# Patient Record
Sex: Male | Born: 1940 | Race: White | Hispanic: No | Marital: Married | Smoking: Former smoker
Health system: Southern US, Community
[De-identification: ages and names within clinical notes are randomized; demographics above are authoritative.]

## PROBLEM LIST (undated history)

## (undated) ENCOUNTER — Ambulatory Visit

## (undated) DIAGNOSIS — M1990 Unspecified osteoarthritis, unspecified site: Secondary | ICD-10-CM

## (undated) DIAGNOSIS — R112 Nausea with vomiting, unspecified: Secondary | ICD-10-CM

## (undated) DIAGNOSIS — Z9889 Other specified postprocedural states: Secondary | ICD-10-CM

## (undated) DIAGNOSIS — T300 Burn of unspecified body region, unspecified degree: Secondary | ICD-10-CM

## (undated) DIAGNOSIS — I10 Essential (primary) hypertension: Secondary | ICD-10-CM

## (undated) DIAGNOSIS — K219 Gastro-esophageal reflux disease without esophagitis: Secondary | ICD-10-CM

## (undated) DIAGNOSIS — Z87442 Personal history of urinary calculi: Secondary | ICD-10-CM

## (undated) DIAGNOSIS — J189 Pneumonia, unspecified organism: Secondary | ICD-10-CM

## (undated) DIAGNOSIS — E7800 Pure hypercholesterolemia, unspecified: Secondary | ICD-10-CM

## (undated) HISTORY — PX: SKIN GRAFT: SHX250

## (undated) HISTORY — PX: CHOLECYSTECTOMY: SHX55

## (undated) HISTORY — PX: OTHER SURGICAL HISTORY: SHX169

---

## 2000-02-20 ENCOUNTER — Emergency Department (HOSPITAL_COMMUNITY): Admission: EM | Admit: 2000-02-20 | Discharge: 2000-02-20 | Payer: Self-pay | Admitting: Emergency Medicine

## 2000-02-20 ENCOUNTER — Encounter: Payer: Self-pay | Admitting: Emergency Medicine

## 2001-06-27 ENCOUNTER — Encounter: Payer: Self-pay | Admitting: Family Medicine

## 2001-06-27 ENCOUNTER — Encounter: Admission: RE | Admit: 2001-06-27 | Discharge: 2001-06-27 | Payer: Self-pay | Admitting: Family Medicine

## 2002-06-12 ENCOUNTER — Ambulatory Visit (HOSPITAL_COMMUNITY): Admission: RE | Admit: 2002-06-12 | Discharge: 2002-06-12 | Payer: Self-pay | Admitting: Gastroenterology

## 2002-06-12 ENCOUNTER — Encounter (INDEPENDENT_AMBULATORY_CARE_PROVIDER_SITE_OTHER): Payer: Self-pay | Admitting: *Deleted

## 2009-02-10 ENCOUNTER — Ambulatory Visit (HOSPITAL_BASED_OUTPATIENT_CLINIC_OR_DEPARTMENT_OTHER): Admission: RE | Admit: 2009-02-10 | Discharge: 2009-02-10 | Payer: Self-pay | Admitting: Orthopedic Surgery

## 2011-03-10 LAB — BASIC METABOLIC PANEL
BUN: 19 mg/dL (ref 6–23)
CO2: 27 mEq/L (ref 19–32)
Calcium: 10.5 mg/dL (ref 8.4–10.5)
Chloride: 105 mEq/L (ref 96–112)
Creatinine, Ser: 0.87 mg/dL (ref 0.4–1.5)
GFR calc Af Amer: >60 mL/min (ref >60)
GFR calc non Af Amer: >60 mL/min (ref >60)
Glucose, Bld: 106 mg/dL — ABNORMAL HIGH (ref 70–99)
Potassium: 4.3 mEq/L (ref 3.5–5.1)
Sodium: 139 mEq/L (ref 135–145)

## 2011-03-10 LAB — POCT HEMOGLOBIN-HEMACUE: Hemoglobin: 15.4 g/dL (ref 13.0–17.0)
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
## 2011-04-15 NOTE — Procedures (Signed)
[facility]  Patient:    Craig [name], Craig [name] Visit Number: [id number] MRN: [id number]          Service Type: END Location: ENDO Attending Physician:  Nelda Marseille Dictated by:   Petra Kuba, M.D. Proc. Date: 06/12/02 Admit Date:  06/12/2002 Discharge Date: 06/12/2002   CC:         Teena Irani. Arlyce Dice, M.D.   Procedure Report  PROCEDURE:  Colonoscopy with polypectomy.  INDICATIONS FOR PROCEDURE:  Constipation, colon screening.  Consent was signed after risks, benefits, methods, and options were thoroughly discussed in the office.  MEDICINES USED:  Demerol 70, Versed 6.  DESCRIPTION OF PROCEDURE:  Rectal inspection was pertinent for external hemorrhoids. Digital exam was negative. The video colonoscope was inserted and fairly easily advanced around the colon to the cecum. This did require some left sided abdominal pressure and rolling him on his back. On insertion, some left sided diverticula were seen but no other abnormalities. The cecum was identified by the appendiceal orifice and the ileocecal valve. The scope was slowly withdrawn. The prep on the right side was fairly adequate and did have a moderate amount of liquid stool that required lots of washing and suctioning. The remainder of the prep was adequate with only a little liquid stool that required washing and suctioning. On slow withdrawal through the colon, the cecum, ascending, transverse and majority of the descending was normal. There was some sigmoid diverticula and in the distal sigmoid two tiny polyps were seen and were hot biopsied x1. Once back in the rectum, a tiny hyperplastic appearing polyp was seen and was hot biopsied x1 as well. The scope was retroflexed revealing some internal hemorrhoids. Anal rectal pull-through confirmed the above. The scope was straightened and readvanced a short ways up the sigmoid, air was suctioned, scope removed. The patient tolerated the  procedure well. There was on obvious or immediate complications.  ENDOSCOPIC DIAGNOSIS: 1. Internal and external hemorrhoids. 2. Left sided diverticula. 3. Three tiny rectal and distal sigmoid polyps hot biopsied. 4. Otherwise within normal limits to the cecum.  PLAN:  Yearly rectals and guaiacs per Dr. Arlyce Dice. Happy to see back p.r.n., otherwise, await pathology to determine future colonic screening. Dictated by:   Petra Kuba, M.D. Attending Physician:  Nelda Marseille DD:  06/12/02 TD:  06/15/02 Job: [id number]

## 2012-01-06 ENCOUNTER — Emergency Department (HOSPITAL_COMMUNITY)
Admission: EM | Admit: 2012-01-06 | Discharge: 2012-01-06 | Disposition: A | Discharge status: Home / Self Care | Payer: Medicare Other | Attending: Emergency Medicine | Admitting: Emergency Medicine

## 2012-01-06 ENCOUNTER — Other Ambulatory Visit: Payer: Self-pay

## 2012-01-06 ENCOUNTER — Emergency Department (HOSPITAL_COMMUNITY): Payer: Medicare Other

## 2012-01-06 ENCOUNTER — Encounter (HOSPITAL_COMMUNITY): Payer: Self-pay | Admitting: Emergency Medicine

## 2012-01-06 DIAGNOSIS — R51 Headache: Secondary | ICD-10-CM | POA: Diagnosis not present

## 2012-01-06 DIAGNOSIS — Z7982 Long term (current) use of aspirin: Secondary | ICD-10-CM | POA: Diagnosis not present

## 2012-01-06 DIAGNOSIS — R4182 Altered mental status, unspecified: Secondary | ICD-10-CM | POA: Insufficient documentation

## 2012-01-06 DIAGNOSIS — R29818 Other symptoms and signs involving the nervous system: Secondary | ICD-10-CM | POA: Diagnosis not present

## 2012-01-06 DIAGNOSIS — Z79899 Other long term (current) drug therapy: Secondary | ICD-10-CM | POA: Insufficient documentation

## 2012-01-06 DIAGNOSIS — G319 Degenerative disease of nervous system, unspecified: Secondary | ICD-10-CM | POA: Diagnosis not present

## 2012-01-06 DIAGNOSIS — I10 Essential (primary) hypertension: Secondary | ICD-10-CM | POA: Diagnosis not present

## 2012-01-06 DIAGNOSIS — F29 Unspecified psychosis not due to a substance or known physiological condition: Secondary | ICD-10-CM | POA: Insufficient documentation

## 2012-01-06 DIAGNOSIS — T300 Burn of unspecified body region, unspecified degree: Secondary | ICD-10-CM | POA: Insufficient documentation

## 2012-01-06 HISTORY — DX: Essential (primary) hypertension: I10

## 2012-01-06 HISTORY — DX: Burn of unspecified body region, unspecified degree: T30.0

## 2012-01-06 LAB — COMPREHENSIVE METABOLIC PANEL
BUN: 13 mg/dL (ref 6–23)
CO2: 23 mEq/L (ref 19–32)
Calcium: 11.2 mg/dL — ABNORMAL HIGH (ref 8.4–10.5)
Chloride: 105 mEq/L (ref 96–112)
Creatinine, Ser: 0.77 mg/dL (ref 0.50–1.35)
GFR calc Af Amer: >90 mL/min (ref >90)
GFR calc non Af Amer: >90 mL/min (ref >90)
Glucose, Bld: 82 mg/dL (ref 70–99)
Total Bilirubin: 0.6 mg/dL (ref 0.3–1.2)

## 2012-01-06 LAB — CBC
Hemoglobin: 15.6 g/dL (ref 13.0–17.0)
MCH: 32.1 pg (ref 26.0–34.0)
Platelets: 147 10*3/uL — ABNORMAL LOW (ref 150–400)
RBC: 4.86 MIL/uL (ref 4.22–5.81)
WBC: 6.8 10*3/uL (ref 4.0–10.5)

## 2012-01-06 LAB — PROTIME-INR
INR: 1.02 (ref 0.00–1.49)
Prothrombin Time: 13.6 seconds (ref 11.6–15.2)

## 2012-01-06 LAB — DIFFERENTIAL
Eosinophils Absolute: 0.4 10*3/uL (ref 0.0–0.7)
Lymphocytes Relative: 23 % (ref 12–46)
Lymphs Abs: 1.5 10*3/uL (ref 0.7–4.0)
Neutro Abs: 4.2 10*3/uL (ref 1.7–7.7)
Neutrophils Relative %: 62 % (ref 43–77)
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
## 2012-01-06 NOTE — ED Notes (Signed)
Patient transported to CT 

## 2012-01-06 NOTE — ED Notes (Signed)
EDP at bedside to assess; family at bedside

## 2012-01-06 NOTE — ED Notes (Signed)
Pt here for confusion onset about 1500; per family pt did not know where they were going and had sharp pain in back of head; pt alert at present without neuro deficit; pt eval by EDP

## 2012-01-27 DIAGNOSIS — D239 Other benign neoplasm of skin, unspecified: Secondary | ICD-10-CM | POA: Diagnosis not present

## 2012-01-27 DIAGNOSIS — D485 Neoplasm of uncertain behavior of skin: Secondary | ICD-10-CM | POA: Diagnosis not present

## 2012-01-30 ENCOUNTER — Other Ambulatory Visit: Payer: Self-pay | Admitting: Dermatology

## 2012-01-30 DIAGNOSIS — B079 Viral wart, unspecified: Secondary | ICD-10-CM | POA: Diagnosis not present

## 2012-02-08 DIAGNOSIS — Z79899 Other long term (current) drug therapy: Secondary | ICD-10-CM | POA: Diagnosis not present

## 2012-05-03 DIAGNOSIS — S0990XA Unspecified injury of head, initial encounter: Secondary | ICD-10-CM | POA: Diagnosis not present

## 2012-05-03 DIAGNOSIS — R51 Headache: Secondary | ICD-10-CM | POA: Diagnosis not present

## 2012-05-03 DIAGNOSIS — S199XXA Unspecified injury of neck, initial encounter: Secondary | ICD-10-CM | POA: Diagnosis not present

## 2012-05-03 DIAGNOSIS — S0180XA Unspecified open wound of other part of head, initial encounter: Secondary | ICD-10-CM | POA: Diagnosis not present

## 2012-05-03 DIAGNOSIS — I10 Essential (primary) hypertension: Secondary | ICD-10-CM | POA: Diagnosis not present

## 2012-05-03 DIAGNOSIS — S0993XA Unspecified injury of face, initial encounter: Secondary | ICD-10-CM | POA: Diagnosis not present

## 2012-05-09 DIAGNOSIS — I10 Essential (primary) hypertension: Secondary | ICD-10-CM | POA: Diagnosis not present

## 2012-05-09 DIAGNOSIS — E7800 Pure hypercholesterolemia, unspecified: Secondary | ICD-10-CM | POA: Diagnosis not present

## 2012-07-19 DIAGNOSIS — H25049 Posterior subcapsular polar age-related cataract, unspecified eye: Secondary | ICD-10-CM | POA: Diagnosis not present

## 2012-09-06 DIAGNOSIS — Z23 Encounter for immunization: Secondary | ICD-10-CM | POA: Diagnosis not present

## 2012-12-21 DIAGNOSIS — E7800 Pure hypercholesterolemia, unspecified: Secondary | ICD-10-CM | POA: Diagnosis not present

## 2012-12-21 DIAGNOSIS — L659 Nonscarring hair loss, unspecified: Secondary | ICD-10-CM | POA: Diagnosis not present

## 2012-12-21 DIAGNOSIS — I10 Essential (primary) hypertension: Secondary | ICD-10-CM | POA: Diagnosis not present

## 2012-12-21 DIAGNOSIS — Z0000 Encounter for general adult medical examination without abnormal findings: Secondary | ICD-10-CM | POA: Diagnosis not present

## 2012-12-21 DIAGNOSIS — Z125 Encounter for screening for malignant neoplasm of prostate: Secondary | ICD-10-CM | POA: Diagnosis not present

## 2013-02-02 ENCOUNTER — Encounter (HOSPITAL_BASED_OUTPATIENT_CLINIC_OR_DEPARTMENT_OTHER): Payer: Self-pay | Admitting: Emergency Medicine

## 2013-02-02 ENCOUNTER — Emergency Department (HOSPITAL_BASED_OUTPATIENT_CLINIC_OR_DEPARTMENT_OTHER)
Admission: EM | Admit: 2013-02-02 | Discharge: 2013-02-02 | Disposition: A | Discharge status: Home / Self Care | Payer: Medicare Other | Attending: Emergency Medicine | Admitting: Emergency Medicine

## 2013-02-02 ENCOUNTER — Emergency Department (HOSPITAL_BASED_OUTPATIENT_CLINIC_OR_DEPARTMENT_OTHER): Payer: Medicare Other

## 2013-02-02 DIAGNOSIS — Z7982 Long term (current) use of aspirin: Secondary | ICD-10-CM | POA: Diagnosis not present

## 2013-02-02 DIAGNOSIS — S20219A Contusion of unspecified front wall of thorax, initial encounter: Secondary | ICD-10-CM | POA: Diagnosis not present

## 2013-02-02 DIAGNOSIS — Z79899 Other long term (current) drug therapy: Secondary | ICD-10-CM | POA: Diagnosis not present

## 2013-02-02 DIAGNOSIS — I10 Essential (primary) hypertension: Secondary | ICD-10-CM | POA: Diagnosis not present

## 2013-02-02 DIAGNOSIS — E7800 Pure hypercholesterolemia, unspecified: Secondary | ICD-10-CM | POA: Insufficient documentation

## 2013-02-02 DIAGNOSIS — Z87828 Personal history of other (healed) physical injury and trauma: Secondary | ICD-10-CM | POA: Diagnosis not present

## 2013-02-02 DIAGNOSIS — Z87891 Personal history of nicotine dependence: Secondary | ICD-10-CM | POA: Insufficient documentation

## 2013-02-02 DIAGNOSIS — W208XXA Other cause of strike by thrown, projected or falling object, initial encounter: Secondary | ICD-10-CM | POA: Insufficient documentation

## 2013-02-02 DIAGNOSIS — Y929 Unspecified place or not applicable: Secondary | ICD-10-CM | POA: Insufficient documentation

## 2013-02-02 DIAGNOSIS — Y9389 Activity, other specified: Secondary | ICD-10-CM | POA: Insufficient documentation

## 2013-02-02 DIAGNOSIS — S298XXA Other specified injuries of thorax, initial encounter: Secondary | ICD-10-CM | POA: Diagnosis not present

## 2013-02-02 DIAGNOSIS — R079 Chest pain, unspecified: Secondary | ICD-10-CM | POA: Diagnosis not present

## 2013-02-02 HISTORY — DX: Pure hypercholesterolemia, unspecified: E78.00

## 2013-02-02 MED ORDER — HYDROCODONE-ACETAMINOPHEN 5-325 MG PO TABS: 1.0000 | ORAL_TABLET | ORAL | Status: DC | PRN

## 2013-02-02 MED ORDER — METHOCARBAMOL 500 MG PO TABS: 500.0000 mg | ORAL_TABLET | Freq: Two times a day (BID) | ORAL | Status: DC
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
## 2013-06-20 DIAGNOSIS — E7800 Pure hypercholesterolemia, unspecified: Secondary | ICD-10-CM | POA: Diagnosis not present

## 2013-06-20 DIAGNOSIS — I10 Essential (primary) hypertension: Secondary | ICD-10-CM | POA: Diagnosis not present

## 2013-07-25 ENCOUNTER — Other Ambulatory Visit: Payer: Self-pay | Admitting: Gastroenterology

## 2013-07-25 DIAGNOSIS — Z86010 Personal history of colonic polyps: Secondary | ICD-10-CM | POA: Diagnosis not present

## 2013-07-25 DIAGNOSIS — D126 Benign neoplasm of colon, unspecified: Secondary | ICD-10-CM | POA: Diagnosis not present

## 2013-07-25 DIAGNOSIS — K5730 Diverticulosis of large intestine without perforation or abscess without bleeding: Secondary | ICD-10-CM | POA: Diagnosis not present

## 2013-07-25 DIAGNOSIS — Z09 Encounter for follow-up examination after completed treatment for conditions other than malignant neoplasm: Secondary | ICD-10-CM | POA: Diagnosis not present

## 2013-09-26 DIAGNOSIS — Z23 Encounter for immunization: Secondary | ICD-10-CM | POA: Diagnosis not present

## 2013-11-18 DIAGNOSIS — H25049 Posterior subcapsular polar age-related cataract, unspecified eye: Secondary | ICD-10-CM | POA: Diagnosis not present

## 2013-12-17 DIAGNOSIS — H01009 Unspecified blepharitis unspecified eye, unspecified eyelid: Secondary | ICD-10-CM | POA: Diagnosis not present

## 2013-12-17 DIAGNOSIS — H25049 Posterior subcapsular polar age-related cataract, unspecified eye: Secondary | ICD-10-CM | POA: Diagnosis not present

## 2013-12-17 DIAGNOSIS — H2510 Age-related nuclear cataract, unspecified eye: Secondary | ICD-10-CM | POA: Diagnosis not present

## 2013-12-17 DIAGNOSIS — H4320 Crystalline deposits in vitreous body, unspecified eye: Secondary | ICD-10-CM | POA: Diagnosis not present

## 2014-01-08 DIAGNOSIS — H25049 Posterior subcapsular polar age-related cataract, unspecified eye: Secondary | ICD-10-CM | POA: Diagnosis not present

## 2014-01-08 DIAGNOSIS — H2510 Age-related nuclear cataract, unspecified eye: Secondary | ICD-10-CM | POA: Diagnosis not present

## 2014-01-08 DIAGNOSIS — H2589 Other age-related cataract: Secondary | ICD-10-CM | POA: Diagnosis not present

## 2014-01-29 DIAGNOSIS — Z125 Encounter for screening for malignant neoplasm of prostate: Secondary | ICD-10-CM | POA: Diagnosis not present

## 2014-01-29 DIAGNOSIS — L659 Nonscarring hair loss, unspecified: Secondary | ICD-10-CM | POA: Diagnosis not present

## 2014-01-29 DIAGNOSIS — Z0000 Encounter for general adult medical examination without abnormal findings: Secondary | ICD-10-CM | POA: Diagnosis not present

## 2014-01-29 DIAGNOSIS — N400 Enlarged prostate without lower urinary tract symptoms: Secondary | ICD-10-CM | POA: Diagnosis not present

## 2014-01-29 DIAGNOSIS — L259 Unspecified contact dermatitis, unspecified cause: Secondary | ICD-10-CM | POA: Diagnosis not present

## 2014-01-29 DIAGNOSIS — Z23 Encounter for immunization: Secondary | ICD-10-CM | POA: Diagnosis not present

## 2014-01-29 DIAGNOSIS — E7800 Pure hypercholesterolemia, unspecified: Secondary | ICD-10-CM | POA: Diagnosis not present

## 2014-01-29 DIAGNOSIS — I10 Essential (primary) hypertension: Secondary | ICD-10-CM | POA: Diagnosis not present

## 2014-02-26 DIAGNOSIS — H25019 Cortical age-related cataract, unspecified eye: Secondary | ICD-10-CM | POA: Diagnosis not present

## 2014-02-26 DIAGNOSIS — H25049 Posterior subcapsular polar age-related cataract, unspecified eye: Secondary | ICD-10-CM | POA: Diagnosis not present

## 2014-02-26 DIAGNOSIS — H2510 Age-related nuclear cataract, unspecified eye: Secondary | ICD-10-CM | POA: Diagnosis not present

## 2014-02-26 DIAGNOSIS — H2589 Other age-related cataract: Secondary | ICD-10-CM | POA: Diagnosis not present

## 2014-04-03 DIAGNOSIS — L98499 Non-pressure chronic ulcer of skin of other sites with unspecified severity: Secondary | ICD-10-CM | POA: Diagnosis not present

## 2014-08-06 DIAGNOSIS — Z23 Encounter for immunization: Secondary | ICD-10-CM | POA: Diagnosis not present

## 2014-08-06 DIAGNOSIS — I10 Essential (primary) hypertension: Secondary | ICD-10-CM | POA: Diagnosis not present

## 2014-08-06 DIAGNOSIS — E7800 Pure hypercholesterolemia, unspecified: Secondary | ICD-10-CM | POA: Diagnosis not present

## 2014-08-13 DIAGNOSIS — IMO0002 Reserved for concepts with insufficient information to code with codable children: Secondary | ICD-10-CM | POA: Diagnosis not present

## 2014-08-13 DIAGNOSIS — T148 Other injury of unspecified body region: Secondary | ICD-10-CM | POA: Diagnosis not present

## 2015-02-03 DIAGNOSIS — Z0000 Encounter for general adult medical examination without abnormal findings: Secondary | ICD-10-CM | POA: Diagnosis not present

## 2015-02-03 DIAGNOSIS — Z1389 Encounter for screening for other disorder: Secondary | ICD-10-CM | POA: Diagnosis not present

## 2015-02-03 DIAGNOSIS — Z125 Encounter for screening for malignant neoplasm of prostate: Secondary | ICD-10-CM | POA: Diagnosis not present

## 2015-02-03 DIAGNOSIS — E780 Pure hypercholesterolemia: Secondary | ICD-10-CM | POA: Diagnosis not present

## 2015-02-03 DIAGNOSIS — I10 Essential (primary) hypertension: Secondary | ICD-10-CM | POA: Diagnosis not present

## 2015-02-03 DIAGNOSIS — N529 Male erectile dysfunction, unspecified: Secondary | ICD-10-CM | POA: Diagnosis not present

## 2015-02-03 DIAGNOSIS — Z8042 Family history of malignant neoplasm of prostate: Secondary | ICD-10-CM | POA: Diagnosis not present

## 2015-02-03 DIAGNOSIS — M21371 Foot drop, right foot: Secondary | ICD-10-CM | POA: Diagnosis not present

## 2015-06-30 DIAGNOSIS — L821 Other seborrheic keratosis: Secondary | ICD-10-CM | POA: Diagnosis not present

## 2015-06-30 DIAGNOSIS — D225 Melanocytic nevi of trunk: Secondary | ICD-10-CM | POA: Diagnosis not present

## 2015-06-30 DIAGNOSIS — L853 Xerosis cutis: Secondary | ICD-10-CM | POA: Diagnosis not present

## 2015-06-30 DIAGNOSIS — B351 Tinea unguium: Secondary | ICD-10-CM | POA: Diagnosis not present

## 2015-06-30 DIAGNOSIS — L814 Other melanin hyperpigmentation: Secondary | ICD-10-CM | POA: Diagnosis not present

## 2015-06-30 DIAGNOSIS — L738 Other specified follicular disorders: Secondary | ICD-10-CM | POA: Diagnosis not present

## 2015-08-10 DIAGNOSIS — Z8042 Family history of malignant neoplasm of prostate: Secondary | ICD-10-CM | POA: Diagnosis not present

## 2015-08-10 DIAGNOSIS — E780 Pure hypercholesterolemia: Secondary | ICD-10-CM | POA: Diagnosis not present

## 2015-08-10 DIAGNOSIS — N529 Male erectile dysfunction, unspecified: Secondary | ICD-10-CM | POA: Diagnosis not present

## 2015-08-10 DIAGNOSIS — I10 Essential (primary) hypertension: Secondary | ICD-10-CM | POA: Diagnosis not present

## 2015-08-10 DIAGNOSIS — L309 Dermatitis, unspecified: Secondary | ICD-10-CM | POA: Diagnosis not present

## 2015-08-10 DIAGNOSIS — Z23 Encounter for immunization: Secondary | ICD-10-CM | POA: Diagnosis not present

## 2016-02-17 DIAGNOSIS — N529 Male erectile dysfunction, unspecified: Secondary | ICD-10-CM | POA: Diagnosis not present

## 2016-02-17 DIAGNOSIS — Z125 Encounter for screening for malignant neoplasm of prostate: Secondary | ICD-10-CM | POA: Diagnosis not present

## 2016-02-17 DIAGNOSIS — Z1389 Encounter for screening for other disorder: Secondary | ICD-10-CM | POA: Diagnosis not present

## 2016-02-17 DIAGNOSIS — Z0000 Encounter for general adult medical examination without abnormal findings: Secondary | ICD-10-CM | POA: Diagnosis not present

## 2016-02-17 DIAGNOSIS — B354 Tinea corporis: Secondary | ICD-10-CM | POA: Diagnosis not present

## 2016-02-17 DIAGNOSIS — E7800 Pure hypercholesterolemia, unspecified: Secondary | ICD-10-CM | POA: Diagnosis not present

## 2016-02-17 DIAGNOSIS — L309 Dermatitis, unspecified: Secondary | ICD-10-CM | POA: Diagnosis not present

## 2016-02-17 DIAGNOSIS — I10 Essential (primary) hypertension: Secondary | ICD-10-CM | POA: Diagnosis not present

## 2016-02-17 DIAGNOSIS — M21371 Foot drop, right foot: Secondary | ICD-10-CM | POA: Diagnosis not present

## 2016-02-17 DIAGNOSIS — Z8042 Family history of malignant neoplasm of prostate: Secondary | ICD-10-CM | POA: Diagnosis not present

## 2016-08-12 DIAGNOSIS — E7800 Pure hypercholesterolemia, unspecified: Secondary | ICD-10-CM | POA: Diagnosis not present

## 2016-08-12 DIAGNOSIS — Z23 Encounter for immunization: Secondary | ICD-10-CM | POA: Diagnosis not present

## 2016-08-12 DIAGNOSIS — I10 Essential (primary) hypertension: Secondary | ICD-10-CM | POA: Diagnosis not present

## 2016-12-19 DIAGNOSIS — Z961 Presence of intraocular lens: Secondary | ICD-10-CM | POA: Diagnosis not present

## 2016-12-19 DIAGNOSIS — H4321 Crystalline deposits in vitreous body, right eye: Secondary | ICD-10-CM | POA: Diagnosis not present

## 2016-12-19 DIAGNOSIS — H26491 Other secondary cataract, right eye: Secondary | ICD-10-CM | POA: Diagnosis not present

## 2017-02-20 DIAGNOSIS — Z8042 Family history of malignant neoplasm of prostate: Secondary | ICD-10-CM | POA: Diagnosis not present

## 2017-02-20 DIAGNOSIS — Z125 Encounter for screening for malignant neoplasm of prostate: Secondary | ICD-10-CM | POA: Diagnosis not present

## 2017-02-20 DIAGNOSIS — Z0000 Encounter for general adult medical examination without abnormal findings: Secondary | ICD-10-CM | POA: Diagnosis not present

## 2017-02-20 DIAGNOSIS — E7800 Pure hypercholesterolemia, unspecified: Secondary | ICD-10-CM | POA: Diagnosis not present

## 2017-02-20 DIAGNOSIS — N529 Male erectile dysfunction, unspecified: Secondary | ICD-10-CM | POA: Diagnosis not present

## 2017-02-20 DIAGNOSIS — M21371 Foot drop, right foot: Secondary | ICD-10-CM | POA: Diagnosis not present

## 2017-02-20 DIAGNOSIS — I10 Essential (primary) hypertension: Secondary | ICD-10-CM | POA: Diagnosis not present

## 2017-06-28 DIAGNOSIS — L98499 Non-pressure chronic ulcer of skin of other sites with unspecified severity: Secondary | ICD-10-CM | POA: Diagnosis not present

## 2017-06-28 DIAGNOSIS — L608 Other nail disorders: Secondary | ICD-10-CM | POA: Diagnosis not present

## 2017-08-24 DIAGNOSIS — R42 Dizziness and giddiness: Secondary | ICD-10-CM | POA: Diagnosis not present

## 2017-08-24 DIAGNOSIS — Z23 Encounter for immunization: Secondary | ICD-10-CM | POA: Diagnosis not present

## 2017-08-24 DIAGNOSIS — I10 Essential (primary) hypertension: Secondary | ICD-10-CM | POA: Diagnosis not present

## 2017-08-24 DIAGNOSIS — E7800 Pure hypercholesterolemia, unspecified: Secondary | ICD-10-CM | POA: Diagnosis not present

## 2017-08-24 DIAGNOSIS — J309 Allergic rhinitis, unspecified: Secondary | ICD-10-CM | POA: Diagnosis not present

## 2017-08-24 DIAGNOSIS — T148XXD Other injury of unspecified body region, subsequent encounter: Secondary | ICD-10-CM | POA: Diagnosis not present

## 2018-04-27 DIAGNOSIS — H524 Presbyopia: Secondary | ICD-10-CM | POA: Diagnosis not present

## 2018-04-27 DIAGNOSIS — H26491 Other secondary cataract, right eye: Secondary | ICD-10-CM | POA: Diagnosis not present

## 2018-04-27 DIAGNOSIS — Z961 Presence of intraocular lens: Secondary | ICD-10-CM | POA: Diagnosis not present

## 2018-04-27 DIAGNOSIS — H4321 Crystalline deposits in vitreous body, right eye: Secondary | ICD-10-CM | POA: Diagnosis not present

## 2018-06-25 DIAGNOSIS — M1990 Unspecified osteoarthritis, unspecified site: Secondary | ICD-10-CM | POA: Diagnosis not present

## 2018-06-25 DIAGNOSIS — Z1389 Encounter for screening for other disorder: Secondary | ICD-10-CM | POA: Diagnosis not present

## 2018-06-25 DIAGNOSIS — M21371 Foot drop, right foot: Secondary | ICD-10-CM | POA: Diagnosis not present

## 2018-06-25 DIAGNOSIS — Z6827 Body mass index (BMI) 27.0-27.9, adult: Secondary | ICD-10-CM | POA: Diagnosis not present

## 2018-06-25 DIAGNOSIS — N529 Male erectile dysfunction, unspecified: Secondary | ICD-10-CM | POA: Diagnosis not present

## 2018-06-25 DIAGNOSIS — E7800 Pure hypercholesterolemia, unspecified: Secondary | ICD-10-CM | POA: Diagnosis not present

## 2018-06-25 DIAGNOSIS — Z125 Encounter for screening for malignant neoplasm of prostate: Secondary | ICD-10-CM | POA: Diagnosis not present

## 2018-06-25 DIAGNOSIS — Z0000 Encounter for general adult medical examination without abnormal findings: Secondary | ICD-10-CM | POA: Diagnosis not present

## 2018-06-25 DIAGNOSIS — I10 Essential (primary) hypertension: Secondary | ICD-10-CM | POA: Diagnosis not present

## 2018-09-06 DIAGNOSIS — Z23 Encounter for immunization: Secondary | ICD-10-CM | POA: Diagnosis not present

## 2018-09-06 DIAGNOSIS — J209 Acute bronchitis, unspecified: Secondary | ICD-10-CM | POA: Diagnosis not present

## 2018-09-06 DIAGNOSIS — L989 Disorder of the skin and subcutaneous tissue, unspecified: Secondary | ICD-10-CM | POA: Diagnosis not present

## 2018-10-17 DIAGNOSIS — L0889 Other specified local infections of the skin and subcutaneous tissue: Secondary | ICD-10-CM | POA: Diagnosis not present

## 2018-10-17 DIAGNOSIS — L0390 Cellulitis, unspecified: Secondary | ICD-10-CM | POA: Diagnosis not present

## 2018-12-26 DIAGNOSIS — E7800 Pure hypercholesterolemia, unspecified: Secondary | ICD-10-CM | POA: Diagnosis not present

## 2018-12-26 DIAGNOSIS — I10 Essential (primary) hypertension: Secondary | ICD-10-CM | POA: Diagnosis not present

## 2018-12-26 DIAGNOSIS — R6882 Decreased libido: Secondary | ICD-10-CM | POA: Diagnosis not present

## 2018-12-26 DIAGNOSIS — N529 Male erectile dysfunction, unspecified: Secondary | ICD-10-CM | POA: Diagnosis not present

## 2018-12-26 DIAGNOSIS — L918 Other hypertrophic disorders of the skin: Secondary | ICD-10-CM | POA: Diagnosis not present

## 2019-02-07 DIAGNOSIS — S41101A Unspecified open wound of right upper arm, initial encounter: Secondary | ICD-10-CM | POA: Diagnosis not present

## 2019-02-07 DIAGNOSIS — T148XXA Other injury of unspecified body region, initial encounter: Secondary | ICD-10-CM | POA: Diagnosis not present

## 2019-04-29 DIAGNOSIS — H43813 Vitreous degeneration, bilateral: Secondary | ICD-10-CM | POA: Diagnosis not present

## 2019-04-29 DIAGNOSIS — H26491 Other secondary cataract, right eye: Secondary | ICD-10-CM | POA: Diagnosis not present

## 2019-04-29 DIAGNOSIS — H52202 Unspecified astigmatism, left eye: Secondary | ICD-10-CM | POA: Diagnosis not present

## 2019-04-29 DIAGNOSIS — H4321 Crystalline deposits in vitreous body, right eye: Secondary | ICD-10-CM | POA: Diagnosis not present

## 2019-05-01 DIAGNOSIS — Z1159 Encounter for screening for other viral diseases: Secondary | ICD-10-CM | POA: Diagnosis not present

## 2019-05-03 DIAGNOSIS — S51001A Unspecified open wound of right elbow, initial encounter: Secondary | ICD-10-CM | POA: Diagnosis not present

## 2019-05-03 DIAGNOSIS — I10 Essential (primary) hypertension: Secondary | ICD-10-CM | POA: Diagnosis not present

## 2019-05-03 DIAGNOSIS — E785 Hyperlipidemia, unspecified: Secondary | ICD-10-CM | POA: Diagnosis not present

## 2019-05-03 DIAGNOSIS — Z87891 Personal history of nicotine dependence: Secondary | ICD-10-CM | POA: Diagnosis not present

## 2019-05-03 DIAGNOSIS — S51001D Unspecified open wound of right elbow, subsequent encounter: Secondary | ICD-10-CM | POA: Diagnosis not present

## 2019-07-08 DIAGNOSIS — Z1389 Encounter for screening for other disorder: Secondary | ICD-10-CM | POA: Diagnosis not present

## 2019-07-08 DIAGNOSIS — I10 Essential (primary) hypertension: Secondary | ICD-10-CM | POA: Diagnosis not present

## 2019-07-08 DIAGNOSIS — E7800 Pure hypercholesterolemia, unspecified: Secondary | ICD-10-CM | POA: Diagnosis not present

## 2019-07-08 DIAGNOSIS — Z0000 Encounter for general adult medical examination without abnormal findings: Secondary | ICD-10-CM | POA: Diagnosis not present

## 2019-07-08 DIAGNOSIS — L309 Dermatitis, unspecified: Secondary | ICD-10-CM | POA: Diagnosis not present

## 2019-07-08 DIAGNOSIS — M21371 Foot drop, right foot: Secondary | ICD-10-CM | POA: Diagnosis not present

## 2019-07-08 DIAGNOSIS — M1990 Unspecified osteoarthritis, unspecified site: Secondary | ICD-10-CM | POA: Diagnosis not present

## 2019-07-08 DIAGNOSIS — N529 Male erectile dysfunction, unspecified: Secondary | ICD-10-CM | POA: Diagnosis not present

## 2019-07-08 DIAGNOSIS — J309 Allergic rhinitis, unspecified: Secondary | ICD-10-CM | POA: Diagnosis not present

## 2019-07-08 DIAGNOSIS — Z8042 Family history of malignant neoplasm of prostate: Secondary | ICD-10-CM | POA: Diagnosis not present

## 2019-07-10 DIAGNOSIS — Z8042 Family history of malignant neoplasm of prostate: Secondary | ICD-10-CM | POA: Diagnosis not present

## 2019-07-10 DIAGNOSIS — Z125 Encounter for screening for malignant neoplasm of prostate: Secondary | ICD-10-CM | POA: Diagnosis not present

## 2019-07-10 DIAGNOSIS — E7800 Pure hypercholesterolemia, unspecified: Secondary | ICD-10-CM | POA: Diagnosis not present

## 2019-08-28 DIAGNOSIS — Z23 Encounter for immunization: Secondary | ICD-10-CM | POA: Diagnosis not present

## 2019-10-02 ENCOUNTER — Ambulatory Visit
Admission: EM | Admit: 2019-10-02 | Discharge: 2019-10-02 | Disposition: A | Discharge status: Home / Self Care | Payer: Medicare Other

## 2019-10-02 DIAGNOSIS — W208XXA Other cause of strike by thrown, projected or falling object, initial encounter: Secondary | ICD-10-CM | POA: Diagnosis not present

## 2019-10-02 DIAGNOSIS — Y93H9 Activity, other involving exterior property and land maintenance, building and construction: Secondary | ICD-10-CM | POA: Diagnosis not present

## 2019-10-02 DIAGNOSIS — S0101XA Laceration without foreign body of scalp, initial encounter: Secondary | ICD-10-CM | POA: Diagnosis not present

## 2019-10-02 DIAGNOSIS — Z23 Encounter for immunization: Secondary | ICD-10-CM

## 2019-10-02 MED ORDER — LIDOCAINE-EPINEPHRINE-TETRACAINE (LET) TOPICAL GEL
3.0000 mL | Freq: Once | TOPICAL | Status: AC
  Administered 2019-10-02: 20:00:00 3 mL via TOPICAL

## 2019-10-02 MED ORDER — TETANUS-DIPHTH-ACELL PERTUSSIS 5-2.5-18.5 LF-MCG/0.5 IM SUSP
0.5000 mL | Freq: Once | INTRAMUSCULAR | Status: AC
  Administered 2019-10-02: 20:00:00 0.5 mL via INTRAMUSCULAR

## 2019-10-02 NOTE — Discharge Instructions (Signed)
Tetanus updated. 3 staples applied to scalp. Do not get wet for the first 24 hours. Keep wound clean and dry. You can clean gently with soap and water. Do not soak area in water. Monitor for spreading redness, increased warmth, increased swelling, fever, follow up for reevaluation needed. Otherwise follow up in 7 days for staple removals.  If experiencing headache/blurry vision, nausea/vomiting, confusion/altered mental status, dizziness, weakness, passing out, imbalance, go to the emergency department for further evaluation.
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
## 2019-10-09 ENCOUNTER — Ambulatory Visit
Admission: EM | Admit: 2019-10-09 | Discharge: 2019-10-09 | Disposition: A | Discharge status: Home / Self Care | Payer: Medicare Other

## 2019-10-09 ENCOUNTER — Other Ambulatory Visit: Payer: Self-pay

## 2019-10-09 DIAGNOSIS — Z4802 Encounter for removal of sutures: Secondary | ICD-10-CM

## 2019-10-09 NOTE — ED Triage Notes (Signed)
Pt here for staples removed from top of head. 3 staples removed, no drainage or redness noted.

## 2019-12-01 ENCOUNTER — Encounter: Payer: Self-pay | Admitting: Emergency Medicine

## 2019-12-01 ENCOUNTER — Ambulatory Visit
Admission: EM | Admit: 2019-12-01 | Discharge: 2019-12-01 | Disposition: A | Discharge status: Home / Self Care | Payer: Medicare Other | Attending: Emergency Medicine | Admitting: Emergency Medicine

## 2019-12-01 ENCOUNTER — Other Ambulatory Visit: Payer: Self-pay

## 2019-12-01 DIAGNOSIS — S51002A Unspecified open wound of left elbow, initial encounter: Secondary | ICD-10-CM | POA: Diagnosis not present

## 2019-12-01 MED ORDER — SULFAMETHOXAZOLE-TRIMETHOPRIM 800-160 MG PO TABS: 1.0000 | ORAL_TABLET | Freq: Two times a day (BID) | ORAL | 0 refills | Status: AC
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
## 2019-12-25 DIAGNOSIS — Z23 Encounter for immunization: Secondary | ICD-10-CM | POA: Diagnosis not present

## 2020-01-02 DIAGNOSIS — L03114 Cellulitis of left upper limb: Secondary | ICD-10-CM | POA: Diagnosis not present

## 2020-01-05 DIAGNOSIS — S51002A Unspecified open wound of left elbow, initial encounter: Secondary | ICD-10-CM | POA: Diagnosis not present

## 2020-01-05 DIAGNOSIS — L03114 Cellulitis of left upper limb: Secondary | ICD-10-CM | POA: Diagnosis not present

## 2020-01-05 DIAGNOSIS — M25522 Pain in left elbow: Secondary | ICD-10-CM | POA: Diagnosis not present

## 2020-01-09 DIAGNOSIS — I10 Essential (primary) hypertension: Secondary | ICD-10-CM | POA: Diagnosis not present

## 2020-01-09 DIAGNOSIS — E7800 Pure hypercholesterolemia, unspecified: Secondary | ICD-10-CM | POA: Diagnosis not present

## 2020-01-09 DIAGNOSIS — N529 Male erectile dysfunction, unspecified: Secondary | ICD-10-CM | POA: Diagnosis not present

## 2020-01-09 DIAGNOSIS — S51002D Unspecified open wound of left elbow, subsequent encounter: Secondary | ICD-10-CM | POA: Diagnosis not present

## 2020-01-22 DIAGNOSIS — Z23 Encounter for immunization: Secondary | ICD-10-CM | POA: Diagnosis not present

## 2020-01-30 DIAGNOSIS — Z01818 Encounter for other preprocedural examination: Secondary | ICD-10-CM | POA: Diagnosis not present

## 2020-01-31 DIAGNOSIS — I10 Essential (primary) hypertension: Secondary | ICD-10-CM | POA: Diagnosis not present

## 2020-01-31 DIAGNOSIS — L905 Scar conditions and fibrosis of skin: Secondary | ICD-10-CM | POA: Diagnosis not present

## 2020-01-31 DIAGNOSIS — Z87891 Personal history of nicotine dependence: Secondary | ICD-10-CM | POA: Diagnosis not present

## 2020-01-31 DIAGNOSIS — S51002D Unspecified open wound of left elbow, subsequent encounter: Secondary | ICD-10-CM | POA: Diagnosis not present

## 2020-02-17 DIAGNOSIS — Z20822 Contact with and (suspected) exposure to covid-19: Secondary | ICD-10-CM | POA: Diagnosis not present

## 2020-02-17 DIAGNOSIS — Z01812 Encounter for preprocedural laboratory examination: Secondary | ICD-10-CM | POA: Diagnosis not present

## 2020-02-19 DIAGNOSIS — Z87891 Personal history of nicotine dependence: Secondary | ICD-10-CM | POA: Diagnosis not present

## 2020-02-19 DIAGNOSIS — S51002D Unspecified open wound of left elbow, subsequent encounter: Secondary | ICD-10-CM | POA: Diagnosis not present

## 2020-02-19 DIAGNOSIS — I10 Essential (primary) hypertension: Secondary | ICD-10-CM | POA: Diagnosis not present

## 2020-03-17 DIAGNOSIS — Z1159 Encounter for screening for other viral diseases: Secondary | ICD-10-CM | POA: Diagnosis not present

## 2020-03-20 DIAGNOSIS — K5730 Diverticulosis of large intestine without perforation or abscess without bleeding: Secondary | ICD-10-CM | POA: Diagnosis not present

## 2020-03-20 DIAGNOSIS — Z86010 Personal history of colonic polyps: Secondary | ICD-10-CM | POA: Diagnosis not present

## 2020-05-01 DIAGNOSIS — H43812 Vitreous degeneration, left eye: Secondary | ICD-10-CM | POA: Diagnosis not present

## 2020-05-01 DIAGNOSIS — H26491 Other secondary cataract, right eye: Secondary | ICD-10-CM | POA: Diagnosis not present

## 2020-05-01 DIAGNOSIS — H4321 Crystalline deposits in vitreous body, right eye: Secondary | ICD-10-CM | POA: Diagnosis not present

## 2020-05-01 DIAGNOSIS — H524 Presbyopia: Secondary | ICD-10-CM | POA: Diagnosis not present

## 2020-06-15 ENCOUNTER — Other Ambulatory Visit: Payer: Self-pay | Admitting: Family Medicine

## 2020-06-15 DIAGNOSIS — N63 Unspecified lump in unspecified breast: Secondary | ICD-10-CM | POA: Diagnosis not present

## 2020-06-26 ENCOUNTER — Ambulatory Visit: Payer: Medicare Other

## 2020-06-26 ENCOUNTER — Ambulatory Visit
Admission: RE | Admit: 2020-06-26 | Discharge: 2020-06-26 | Disposition: A | Discharge status: Home / Self Care | Payer: Medicare Other | Source: Ambulatory Visit | Attending: Family Medicine | Admitting: Family Medicine

## 2020-06-26 ENCOUNTER — Other Ambulatory Visit: Payer: Self-pay

## 2020-06-26 DIAGNOSIS — R928 Other abnormal and inconclusive findings on diagnostic imaging of breast: Secondary | ICD-10-CM | POA: Diagnosis not present

## 2020-06-26 DIAGNOSIS — N63 Unspecified lump in unspecified breast: Secondary | ICD-10-CM

## 2020-07-11 ENCOUNTER — Other Ambulatory Visit: Payer: Self-pay

## 2020-07-11 ENCOUNTER — Emergency Department (HOSPITAL_BASED_OUTPATIENT_CLINIC_OR_DEPARTMENT_OTHER): Payer: Medicare Other

## 2020-07-11 ENCOUNTER — Encounter (HOSPITAL_BASED_OUTPATIENT_CLINIC_OR_DEPARTMENT_OTHER): Payer: Self-pay | Admitting: *Deleted

## 2020-07-11 ENCOUNTER — Emergency Department (HOSPITAL_BASED_OUTPATIENT_CLINIC_OR_DEPARTMENT_OTHER)
Admission: EM | Admit: 2020-07-11 | Discharge: 2020-07-11 | Disposition: A | Discharge status: Home / Self Care | Payer: Medicare Other | Attending: Emergency Medicine | Admitting: Emergency Medicine

## 2020-07-11 DIAGNOSIS — Z7982 Long term (current) use of aspirin: Secondary | ICD-10-CM | POA: Diagnosis not present

## 2020-07-11 DIAGNOSIS — Y999 Unspecified external cause status: Secondary | ICD-10-CM | POA: Insufficient documentation

## 2020-07-11 DIAGNOSIS — Z87891 Personal history of nicotine dependence: Secondary | ICD-10-CM | POA: Insufficient documentation

## 2020-07-11 DIAGNOSIS — S92424A Nondisplaced fracture of distal phalanx of right great toe, initial encounter for closed fracture: Secondary | ICD-10-CM | POA: Diagnosis not present

## 2020-07-11 DIAGNOSIS — I10 Essential (primary) hypertension: Secondary | ICD-10-CM | POA: Diagnosis not present

## 2020-07-11 DIAGNOSIS — W19XXXA Unspecified fall, initial encounter: Secondary | ICD-10-CM | POA: Diagnosis not present

## 2020-07-11 DIAGNOSIS — Y939 Activity, unspecified: Secondary | ICD-10-CM | POA: Diagnosis not present

## 2020-07-11 DIAGNOSIS — Y929 Unspecified place or not applicable: Secondary | ICD-10-CM | POA: Insufficient documentation

## 2020-07-11 DIAGNOSIS — S99921A Unspecified injury of right foot, initial encounter: Secondary | ICD-10-CM | POA: Diagnosis present

## 2020-07-11 MED ORDER — HYDROCODONE-ACETAMINOPHEN 5-325 MG PO TABS: 1.0000 | ORAL_TABLET | Freq: Four times a day (QID) | ORAL | 0 refills | Status: DC | PRN

## 2020-07-11 MED ORDER — CEPHALEXIN 500 MG PO CAPS
500.0000 mg | ORAL_CAPSULE | Freq: Three times a day (TID) | ORAL | 0 refills | Status: DC
Start: 2020-07-11 — End: 2020-07-12

## 2020-07-11 MED ORDER — CEPHALEXIN 250 MG PO CAPS
500.0000 mg | ORAL_CAPSULE | Freq: Once | ORAL | Status: AC
  Administered 2020-07-11: 500 mg via ORAL
  Filled 2020-07-11: qty 2

## 2020-07-11 MED ORDER — HYDROCODONE-ACETAMINOPHEN 5-325 MG PO TABS
1.0000 | ORAL_TABLET | Freq: Once | ORAL | Status: AC
  Administered 2020-07-11: 1 via ORAL
  Filled 2020-07-11: qty 1

## 2020-07-11 MED ORDER — CEPHALEXIN 500 MG PO CAPS
500.0000 mg | ORAL_CAPSULE | Freq: Three times a day (TID) | ORAL | 0 refills | Status: DC
Start: 2020-07-11 — End: 2020-07-11

## 2020-07-11 NOTE — Discharge Instructions (Addendum)
Keep your wounds clean and covered. Take the antibiotic, Keflex, as directed until gone. Elevate your toes much as possible to help with pain and swelling. Watch for signs of infection, return to the ED if you experience fevers, significantly worsening redness or swelling, pus drained from your wounds. Call the orthopedic doctor for close follow-up of your visit today. You can take hydrocodone every 6 as needed for severe pain. You can take over-the-counter medications as needed for mild to moderate pain.

## 2020-07-11 NOTE — ED Triage Notes (Signed)
Pt reports the fan blade of his lawnmower hit the great toe of his right foot. Nail is pulled up. C/o pain in ball of foot also. Ambulatory with rolling walker from home
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
## 2020-07-12 MED ORDER — CEPHALEXIN 500 MG PO CAPS
500.0000 mg | ORAL_CAPSULE | Freq: Three times a day (TID) | ORAL | 0 refills | Status: AC
Start: 2020-07-12 — End: 2020-07-19

## 2020-07-12 MED ORDER — HYDROCODONE-ACETAMINOPHEN 5-325 MG PO TABS: 1.0000 | ORAL_TABLET | Freq: Four times a day (QID) | ORAL | 0 refills | Status: DC | PRN

## 2020-07-16 ENCOUNTER — Ambulatory Visit: Payer: Medicare Other

## 2020-07-16 ENCOUNTER — Other Ambulatory Visit: Payer: Self-pay

## 2020-07-16 ENCOUNTER — Ambulatory Visit (INDEPENDENT_AMBULATORY_CARE_PROVIDER_SITE_OTHER): Payer: Medicare Other | Admitting: Podiatry

## 2020-07-16 DIAGNOSIS — S9031XA Contusion of right foot, initial encounter: Secondary | ICD-10-CM

## 2020-07-23 DIAGNOSIS — L309 Dermatitis, unspecified: Secondary | ICD-10-CM | POA: Diagnosis not present

## 2020-07-23 DIAGNOSIS — N529 Male erectile dysfunction, unspecified: Secondary | ICD-10-CM | POA: Diagnosis not present

## 2020-07-23 DIAGNOSIS — E7800 Pure hypercholesterolemia, unspecified: Secondary | ICD-10-CM | POA: Diagnosis not present

## 2020-07-23 DIAGNOSIS — Z0000 Encounter for general adult medical examination without abnormal findings: Secondary | ICD-10-CM | POA: Diagnosis not present

## 2020-07-23 DIAGNOSIS — Z125 Encounter for screening for malignant neoplasm of prostate: Secondary | ICD-10-CM | POA: Diagnosis not present

## 2020-07-23 DIAGNOSIS — H6121 Impacted cerumen, right ear: Secondary | ICD-10-CM | POA: Diagnosis not present

## 2020-07-23 DIAGNOSIS — I10 Essential (primary) hypertension: Secondary | ICD-10-CM | POA: Diagnosis not present

## 2020-07-23 DIAGNOSIS — M21371 Foot drop, right foot: Secondary | ICD-10-CM | POA: Diagnosis not present

## 2020-07-23 DIAGNOSIS — M1990 Unspecified osteoarthritis, unspecified site: Secondary | ICD-10-CM | POA: Diagnosis not present

## 2020-07-23 DIAGNOSIS — J309 Allergic rhinitis, unspecified: Secondary | ICD-10-CM | POA: Diagnosis not present

## 2020-07-23 DIAGNOSIS — Z1389 Encounter for screening for other disorder: Secondary | ICD-10-CM | POA: Diagnosis not present

## 2020-07-23 DIAGNOSIS — Z8042 Family history of malignant neoplasm of prostate: Secondary | ICD-10-CM | POA: Diagnosis not present

## 2020-08-06 NOTE — Progress Notes (Signed)
Subjective:   Patient ID: Craig [name], male   DOB: 79 y.o.   MRN: [id number]   HPI Patient presents stating is a lot of pain in the outside of his right foot and he was concerned that he might have a fracture.  States he did turn his ankle and patient does not currently smoke likes to be active   Review of Systems  All other systems reviewed and are negative.       Objective:  Physical Exam Vitals and nursing note reviewed.  Constitutional:      Appearance: He is well-developed.  Pulmonary:     Effort: Pulmonary effort is normal.  Musculoskeletal:        General: Normal range of motion.  Skin:    General: Skin is warm.  Neurological:     Mental Status: He is alert.     Neurovascular status was found to be intact muscle strength was adequate range of motion somewhat splinted due to pain lateral side right foot and ankle.  There is swelling in the area of bruising and moderate to significant discomfort around the fibula and base of fifth metatarsal     Assessment:  Possibility for fracture versus acute soft tissue injury right ankle lateral foot     Plan:  H&P reviewed condition.  Today I went ahead and I have advised this patient on compression therapy aggressive ice therapy support shoe gear usage and placed on oral anti-inflammatory along with topical diclofenac gel.  Patient will be seen back for [place] to recheck  X-rays were negative currently for signs of fracture appears to be soft tissue

## 2020-08-24 ENCOUNTER — Other Ambulatory Visit: Payer: Self-pay

## 2020-08-24 ENCOUNTER — Ambulatory Visit (INDEPENDENT_AMBULATORY_CARE_PROVIDER_SITE_OTHER): Payer: Medicare Other | Admitting: Internal Medicine

## 2020-08-24 ENCOUNTER — Encounter: Payer: Self-pay | Admitting: Internal Medicine

## 2020-08-24 MED ORDER — FUROSEMIDE 20 MG PO TABS: 10.0000 mg | ORAL_TABLET | Freq: Every day | ORAL | 3 refills | Status: DC

## 2020-08-24 MED ORDER — BISOPROLOL FUMARATE 5 MG PO TABS: 5.0000 mg | ORAL_TABLET | Freq: Every day | ORAL | 3 refills | Status: DC

## 2020-08-24 NOTE — Patient Instructions (Addendum)
Please stop at the lab.  Stop Biotin.  Stop Ziac and start: - Bisoprolol 5 mg daily - Furosemide 10 mg daily  Please come back for a follow-up appointment in 3 months.   Hypercalcemia Hypercalcemia is when the level of calcium in a person's blood is above normal. The body needs calcium to make bones and keep them strong. Calcium also helps the muscles, nerves, brain, and heart work the way they should. Most of the calcium in the body is in the bones. There is also some calcium in the blood. Hypercalcemia can happen when calcium comes out of the bones, or when the kidneys are not able to remove calcium from the blood. Hypercalcemia can be mild or severe. What are the causes? There are many possible causes of hypercalcemia. Common causes of this condition include:  Hyperparathyroidism. This is a condition in which the body produces too much parathyroid hormone. There are four parathyroid glands in your neck. These glands produce a chemical messenger (hormone) that helps the body absorb calcium from foods and helps your bones release calcium.  Certain kinds of cancer. Less common causes of hypercalcemia include:  Getting too much calcium or vitamin D from your diet.  Kidney failure.  Hyperthyroidism.  Severe dehydration.  Being on bed rest or being inactive for a long time.  Certain medicines.  Infections. What increases the risk? You are more likely to develop this condition if you:  Are male.  Are 57 years of age or older.  Have a family history of hypercalcemia. What are the signs or symptoms? Mild hypercalcemia that starts slowly may not cause symptoms. Severe, sudden hypercalcemia is more likely to cause symptoms, such as:  Being more thirsty than usual.  Needing to urinate more often than usual.  Abdominal pain.  Nausea and vomiting.  Constipation.  Muscle pain, twitching, or weakness.  Feeling very tired. How is this diagnosed?  Hypercalcemia is  usually diagnosed with a blood test. You may also have tests to help determine what is causing this condition, such as imaging tests and more blood tests. How is this treated? Treatment for hypercalcemia depends on the cause. Treatment may include:  Receiving fluids through an IV.  Medicines that: ? Keep calcium levels steady after receiving fluids (loop diuretics). ? Keep calcium in your bones (bisphosphonates). ? Lower the calcium level in your blood.  Surgery to remove overactive parathyroid glands.  A procedure that filters your blood to correct calcium levels (hemodialysis). Follow these instructions at home:   Take over-the-counter and prescription medicines only as told by your health care provider.  Follow instructions from your health care provider about eating or drinking restrictions.  Drink enough fluid to keep your urine pale yellow.  Stay active. Weight-bearing exercise helps to keep calcium in your bones. Follow instructions from your health care provider about what type and level of exercise is safe for you.  Keep all follow-up visits as told by your health care provider. This is important. Contact a health care provider if you have:  A fever.  A heartbeat that is irregular or very fast.  Changes in mood, memory, or personality. Get help right away if you:  Have severe abdominal pain.  Have chest pain.  Have trouble breathing.  Become very confused and sleepy.  Lose consciousness. Summary  Hypercalcemia is when the level of calcium in a person's blood is above normal. The body needs calcium to make bones and keep them strong. Calcium also helps the muscles,  nerves, brain, and heart work the way they should.  There are many possible causes of hypercalcemia, and treatment depends on the cause.  Take over-the-counter and prescription medicines only as told by your health care provider.  Follow instructions from your health care provider about eating or  drinking restrictions. This information is not intended to replace advice given to you by your health care provider. Make sure you discuss any questions you have with your health care provider. Document Revised: 12/11/2018 Document Reviewed: 08/20/2018 Elsevier Patient Education  2020 [employer].
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
## 2020-08-25 LAB — VITAMIN D 25 HYDROXY (VIT D DEFICIENCY, FRACTURES): Vit D, 25-Hydroxy: 35.4 ng/mL (ref 30.0–100.0)

## 2020-08-26 ENCOUNTER — Telehealth: Payer: Self-pay

## 2020-08-26 NOTE — Telephone Encounter (Signed)
Patient notified and agrees with plan

## 2020-08-26 NOTE — Telephone Encounter (Signed)
-----   Message from Philemon Kingdom, MD sent at 08/26/2020  9:56 AM EDT ----- Lenna Sciara, can you please call pt: His vitamin D level is normal. Let's wait 2 weeks after changing his blood pressure medication regimen and then have him back for further labs to investigate his high calcium (labs are in).

## 2020-10-23 DIAGNOSIS — Z23 Encounter for immunization: Secondary | ICD-10-CM | POA: Diagnosis not present

## 2020-11-23 ENCOUNTER — Encounter: Payer: Self-pay | Admitting: Internal Medicine

## 2020-11-23 ENCOUNTER — Ambulatory Visit (INDEPENDENT_AMBULATORY_CARE_PROVIDER_SITE_OTHER): Payer: Medicare Other | Admitting: Internal Medicine

## 2020-11-23 ENCOUNTER — Other Ambulatory Visit: Payer: Self-pay

## 2020-11-23 DIAGNOSIS — E559 Vitamin D deficiency, unspecified: Secondary | ICD-10-CM | POA: Diagnosis not present

## 2020-11-23 DIAGNOSIS — I10 Essential (primary) hypertension: Secondary | ICD-10-CM | POA: Diagnosis not present

## 2020-11-23 LAB — MAGNESIUM: Magnesium: 2.2 mg/dL (ref 1.5–2.5)

## 2020-11-23 LAB — PHOSPHORUS: Phosphorus: 3.5 mg/dL (ref 2.3–4.6)
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
## 2020-11-23 NOTE — Patient Instructions (Signed)
Please stop at the lab.  Please collect a 24-hour urine:  Patient information (Up-to-Date): Collection of a 24-hour urine specimen  - You should collect every drop of urine during each 24-hour period. It does not matter how much or little urine is passed each time, as long as every drop is collected. - Begin the urine collection in the morning after you wake up, after you have emptied your bladder for the first time. - Urinate (empty the bladder) for the first time and flush it down the toilet. Note the exact time (eg, 6:15 AM). You will begin the urine collection at this time. - Collect every drop of urine during the day and night in an empty collection bottle. Store the bottle at room temperature or in the refrigerator. - If you need to have a bowel movement, any urine passed with the bowel movement should be collected. Try not to include feces with the urine collection. If feces does get mixed in, do not try to remove the feces from the urine collection bottle. - Finish by collecting the first urine passed the next morning, adding it to the collection bottle. This should be within ten minutes before or after the time of the first morning void on the first day (which was flushed). In this example, you would try to void between 6:05 and 6:25 on the second day. - If you need to urinate one hour before the final collection time, drink a full glass of water so that you can void again at the appropriate time. If you have to urinate 20 minutes before, try to hold the urine until the proper time. - Please note the exact time of the final collection, even if it is not the same time as when collection began on day 1. - The bottle(s) may be kept at room temperature for a day or two, but should be kept cool or refrigerated for longer periods of time.  Please come back for a follow-up appointment in 4 months.

## 2020-11-24 LAB — PARATHYROID HORMONE, INTACT (NO CA): PTH: 23 pg/mL (ref 15–65)

## 2020-11-24 LAB — VITAMIN D 25 HYDROXY (VIT D DEFICIENCY, FRACTURES): Vit D, 25-Hydroxy: 20.1 ng/mL — ABNORMAL LOW (ref 30.0–100.0)

## 2020-11-27 LAB — VITAMIN D 1,25 DIHYDROXY
Vitamin D 1, 25 (OH)2 Total: 38 pg/mL (ref 18–72)
Vitamin D2 1, 25 (OH)2: <8 pg/mL
Vitamin D3 1, 25 (OH)2: 38 pg/mL

## 2020-11-27 LAB — BASIC METABOLIC PANEL WITH GFR
BUN/Creatinine Ratio: 20 (calc) (ref 6–22)
BUN: 25 mg/dL (ref 7–25)
CO2: 26 mmol/L (ref 20–32)
Calcium: 10.8 mg/dL — ABNORMAL HIGH (ref 8.6–10.3)
Chloride: 102 mmol/L (ref 98–110)
Creat: 1.22 mg/dL — ABNORMAL HIGH (ref 0.70–1.18)
GFR, Est African American: 66 mL/min/{1.73_m2} (ref >=60)
GFR, Est Non African American: 57 mL/min/{1.73_m2} — ABNORMAL LOW (ref >=60)
Glucose, Bld: 112 mg/dL — ABNORMAL HIGH (ref 65–99)
Potassium: 4.7 mmol/L (ref 3.5–5.3)
Sodium: 136 mmol/L (ref 135–146)

## 2020-11-27 LAB — CALCIUM, IONIZED: Calcium, Ion: 5.6 mg/dL (ref 4.8–5.6)

## 2020-11-30 ENCOUNTER — Telehealth: Payer: Self-pay | Admitting: Internal Medicine

## 2020-11-30 NOTE — Telephone Encounter (Signed)
Patient returned call to Dr Elvera Lennox regarding lab work.  Ph# [phone]

## 2020-12-01 NOTE — Telephone Encounter (Signed)
Called and left a message for a call back to discuss results.

## 2021-01-25 DIAGNOSIS — I10 Essential (primary) hypertension: Secondary | ICD-10-CM | POA: Diagnosis not present

## 2021-01-25 DIAGNOSIS — M1990 Unspecified osteoarthritis, unspecified site: Secondary | ICD-10-CM | POA: Diagnosis not present

## 2021-01-25 DIAGNOSIS — E7800 Pure hypercholesterolemia, unspecified: Secondary | ICD-10-CM | POA: Diagnosis not present

## 2021-01-27 DIAGNOSIS — Z125 Encounter for screening for malignant neoplasm of prostate: Secondary | ICD-10-CM | POA: Diagnosis not present

## 2021-02-03 ENCOUNTER — Other Ambulatory Visit: Payer: Medicare Other

## 2021-03-24 ENCOUNTER — Ambulatory Visit: Payer: Medicare Other | Admitting: Internal Medicine
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
## 2021-03-26 ENCOUNTER — Other Ambulatory Visit: Payer: Self-pay

## 2021-03-26 ENCOUNTER — Encounter: Payer: Self-pay | Admitting: Internal Medicine

## 2021-03-26 ENCOUNTER — Ambulatory Visit (INDEPENDENT_AMBULATORY_CARE_PROVIDER_SITE_OTHER): Payer: Medicare Other | Admitting: Internal Medicine

## 2021-03-26 DIAGNOSIS — E559 Vitamin D deficiency, unspecified: Secondary | ICD-10-CM | POA: Diagnosis not present

## 2021-03-26 DIAGNOSIS — E210 Primary hyperparathyroidism: Secondary | ICD-10-CM

## 2021-03-26 NOTE — Patient Instructions (Addendum)
Please stop at the lab.  If vitamin D level is normal, please collect a 24-hour urine: Patient information (Up-to-Date): Collection of a 24-hour urine specimen  - You should collect every drop of urine during each 24-hour period. It does not matter how much or little urine is passed each time, as long as every drop is collected. - Begin the urine collection in the morning after you wake up, after you have emptied your bladder for the first time. - Urinate (empty the bladder) for the first time and flush it down the toilet. Note the exact time (eg, 6:15 AM). You will begin the urine collection at this time. - Collect every drop of urine during the day and night in an empty collection bottle. Store the bottle at room temperature or in the refrigerator. - If you need to have a bowel movement, any urine passed with the bowel movement should be collected. Try not to include feces with the urine collection. If feces does get mixed in, do not try to remove the feces from the urine collection bottle. - Finish by collecting the first urine passed the next morning, adding it to the collection bottle. This should be within ten minutes before or after the time of the first morning void on the first day (which was flushed). In this example, you would try to void between 6:05 and 6:25 on the second day. - If you need to urinate one hour before the final collection time, drink a full glass of water so that you can void again at the appropriate time. If you have to urinate 20 minutes before, try to hold the urine until the proper time. - Please note the exact time of the final collection, even if it is not the same time as when collection began on day 1. - The bottle(s) may be kept at room temperature for a day or two, but should be kept cool or refrigerated for longer periods of time.  You will need labs when you bring in the collection.  Please come back for a follow-up appointment in 4 months.
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
## 2021-03-27 LAB — VITAMIN D 25 HYDROXY (VIT D DEFICIENCY, FRACTURES): Vit D, 25-Hydroxy: 39.9 ng/mL (ref 30.0–100.0)

## 2021-03-31 ENCOUNTER — Telehealth: Payer: Self-pay

## 2021-03-31 NOTE — Telephone Encounter (Addendum)
Called and left a message for pt to call back to discuss results. ----- Message from Philemon Kingdom, MD sent at 03/30/2021  1:49 PM EDT ----- Orpha Bur, can you please call pt: Vitamin D is now normal.  Let's continue the current dose of vitamin D supplement (2000 units daily).  He can go ahead and start the 24-hour urine collection at his convenience.  When he comes back to the lab to bring the urine jug, he will need to have blood work: a basic metabolic panel and another parathyroid hormone level.

## 2021-04-05 ENCOUNTER — Other Ambulatory Visit: Payer: Self-pay

## 2021-04-05 ENCOUNTER — Other Ambulatory Visit (INDEPENDENT_AMBULATORY_CARE_PROVIDER_SITE_OTHER): Payer: Medicare Other

## 2021-04-05 LAB — BASIC METABOLIC PANEL
BUN: 23 mg/dL (ref 6–23)
CO2: 25 mEq/L (ref 19–32)
Calcium: 10.6 mg/dL — ABNORMAL HIGH (ref 8.4–10.5)
Chloride: 104 mEq/L (ref 96–112)
Creatinine, Ser: 0.96 mg/dL (ref 0.40–1.50)
GFR: 76.26 mL/min (ref >60.00)
Glucose, Bld: 132 mg/dL — ABNORMAL HIGH (ref 70–99)
Potassium: 4.2 mEq/L (ref 3.5–5.1)
Sodium: 137 mEq/L (ref 135–145)

## 2021-04-06 LAB — PARATHYROID HORMONE, INTACT (NO CA): PTH: 33 pg/mL (ref 15–65)

## 2021-04-06 LAB — CALCIUM, URINE, 24 HOUR: Calcium, 24H Urine: 191 mg/24 h

## 2021-04-06 LAB — CREATININE, URINE, 24 HOUR: Creatinine, 24H Ur: 1.43 g/(24.h) (ref 0.50–2.15)

## 2021-04-06 NOTE — Addendum Note (Signed)
Addended by: Philemon Kingdom on: 04/06/2021 12:41 PM   Modules accepted: Orders

## 2021-04-08 ENCOUNTER — Encounter: Payer: Self-pay | Admitting: Internal Medicine

## 2021-05-25 DIAGNOSIS — E210 Primary hyperparathyroidism: Secondary | ICD-10-CM | POA: Diagnosis not present

## 2021-05-25 DIAGNOSIS — Z87442 Personal history of urinary calculi: Secondary | ICD-10-CM | POA: Diagnosis not present

## 2021-05-27 ENCOUNTER — Emergency Department (HOSPITAL_COMMUNITY): Payer: Medicare Other

## 2021-05-27 ENCOUNTER — Emergency Department (HOSPITAL_COMMUNITY)
Admission: EM | Admit: 2021-05-27 | Discharge: 2021-05-27 | Disposition: A | Discharge status: Home / Self Care | Payer: Medicare Other | Attending: Emergency Medicine | Admitting: Emergency Medicine

## 2021-05-27 ENCOUNTER — Encounter (HOSPITAL_COMMUNITY): Payer: Self-pay | Admitting: Emergency Medicine

## 2021-05-27 DIAGNOSIS — W260XXA Contact with knife, initial encounter: Secondary | ICD-10-CM | POA: Diagnosis not present

## 2021-05-27 DIAGNOSIS — S6992XA Unspecified injury of left wrist, hand and finger(s), initial encounter: Secondary | ICD-10-CM | POA: Diagnosis present

## 2021-05-27 DIAGNOSIS — S61412A Laceration without foreign body of left hand, initial encounter: Secondary | ICD-10-CM | POA: Diagnosis not present

## 2021-05-27 DIAGNOSIS — I10 Essential (primary) hypertension: Secondary | ICD-10-CM | POA: Diagnosis not present

## 2021-05-27 DIAGNOSIS — Z79899 Other long term (current) drug therapy: Secondary | ICD-10-CM | POA: Insufficient documentation

## 2021-05-27 DIAGNOSIS — Z87891 Personal history of nicotine dependence: Secondary | ICD-10-CM | POA: Insufficient documentation

## 2021-05-27 MED ORDER — BACITRACIN ZINC 500 UNIT/GM EX OINT: TOPICAL_OINTMENT | Freq: Two times a day (BID) | CUTANEOUS | Status: DC

## 2021-05-27 MED ORDER — HYDROCODONE-ACETAMINOPHEN 5-325 MG PO TABS
1.0000 | ORAL_TABLET | Freq: Once | ORAL | Status: AC
  Administered 2021-05-27: 1 via ORAL
  Filled 2021-05-27: qty 1

## 2021-05-27 MED ORDER — LIDOCAINE HCL (PF) 1 % IJ SOLN
5.0000 mL | Freq: Once | INTRAMUSCULAR | Status: AC
  Administered 2021-05-27: 5 mL
  Filled 2021-05-27: qty 5
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
## 2021-05-27 NOTE — ED Triage Notes (Signed)
Patient was cleaning a paint tray with a knife when the knife slipped and he stabbed himself in the left hand. Hemorrhage controlled. Patient alert, oriented, and in no apparent distress at this time. Last tetanus shot last year.

## 2021-05-27 NOTE — Discharge Instructions (Addendum)
You have a small laceration of your left hand that was repaired with 2 non-dissolvable sutures.  You will need to have the sutures removed in 10-12 days.  I recommend that you have your primary care provider remove your sutures.  I would like for you to have a wound check in 3 to 5 days to ensure that it is healing appropriately.  Recommend continued topical antibiotics.  Keep the wound clean and covered.  I recommend soap and warm water.  Oral antibiotics not warranted at this time.  Please read the attachment.    Return to the ER or seek immediate medical attention should you experience any new or worsening symptoms
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
## 2021-05-28 DIAGNOSIS — H04123 Dry eye syndrome of bilateral lacrimal glands: Secondary | ICD-10-CM | POA: Diagnosis not present

## 2021-05-28 DIAGNOSIS — H43813 Vitreous degeneration, bilateral: Secondary | ICD-10-CM | POA: Diagnosis not present

## 2021-05-28 DIAGNOSIS — H26491 Other secondary cataract, right eye: Secondary | ICD-10-CM | POA: Diagnosis not present

## 2021-05-28 DIAGNOSIS — H524 Presbyopia: Secondary | ICD-10-CM | POA: Diagnosis not present

## 2021-06-01 ENCOUNTER — Other Ambulatory Visit (HOSPITAL_COMMUNITY): Payer: Self-pay | Admitting: Surgery

## 2021-06-01 DIAGNOSIS — E210 Primary hyperparathyroidism: Secondary | ICD-10-CM

## 2021-06-14 ENCOUNTER — Ambulatory Visit (HOSPITAL_COMMUNITY)
Admission: RE | Admit: 2021-06-14 | Discharge: 2021-06-14 | Disposition: A | Discharge status: Home / Self Care | Payer: Medicare Other | Source: Ambulatory Visit | Attending: Surgery | Admitting: Surgery

## 2021-06-14 ENCOUNTER — Other Ambulatory Visit: Payer: Self-pay

## 2021-06-14 ENCOUNTER — Ambulatory Visit (HOSPITAL_COMMUNITY): Payer: 59

## 2021-06-14 ENCOUNTER — Encounter (HOSPITAL_COMMUNITY): Payer: Self-pay

## 2021-06-14 ENCOUNTER — Encounter (HOSPITAL_COMMUNITY)
Admission: RE | Admit: 2021-06-14 | Discharge: 2021-06-14 | Disposition: A | Discharge status: Home / Self Care | Payer: Medicare Other | Source: Ambulatory Visit | Attending: Surgery | Admitting: Surgery

## 2021-06-14 DIAGNOSIS — E210 Primary hyperparathyroidism: Secondary | ICD-10-CM

## 2021-06-14 MED ORDER — TECHNETIUM TC 99M SESTAMIBI - CARDIOLITE: 27.5000 | Freq: Once | INTRAVENOUS | Status: DC | PRN

## 2021-06-15 NOTE — Progress Notes (Signed)
USN shows nodule in left inferior lobe measuring 2.2 cm that will require biopsy.  I called patient and discussed.  Claiborne Billings - please schedule FNA biopsy with USN guidance.  Armandina Gemma, MD [facility] Office: [phone]

## 2021-06-15 NOTE — Progress Notes (Signed)
I called patient with result.  Positive for left adenoma.  Plan minimally invasive surgery if upcoming thyroid biopsy is benign.  Discussed plans today with patient.  Await biopsy results.  tmg  Armandina Gemma, MD [facility] Office: [phone]

## 2021-06-22 ENCOUNTER — Other Ambulatory Visit: Payer: Self-pay | Admitting: Surgery

## 2021-06-22 DIAGNOSIS — E041 Nontoxic single thyroid nodule: Secondary | ICD-10-CM

## 2021-06-30 ENCOUNTER — Other Ambulatory Visit (HOSPITAL_COMMUNITY)
Admission: RE | Admit: 2021-06-30 | Discharge: 2021-06-30 | Disposition: A | Discharge status: Home / Self Care | Payer: Medicare Other | Source: Ambulatory Visit | Attending: Interventional Radiology | Admitting: Interventional Radiology

## 2021-06-30 ENCOUNTER — Ambulatory Visit
Admission: RE | Admit: 2021-06-30 | Discharge: 2021-06-30 | Disposition: A | Discharge status: Home / Self Care | Payer: Medicare Other | Source: Ambulatory Visit | Attending: Surgery | Admitting: Surgery

## 2021-06-30 DIAGNOSIS — E041 Nontoxic single thyroid nodule: Secondary | ICD-10-CM | POA: Diagnosis not present

## 2021-06-30 DIAGNOSIS — E0789 Other specified disorders of thyroid: Secondary | ICD-10-CM | POA: Diagnosis not present

## 2021-07-01 LAB — CYTOLOGY - NON PAP

## 2021-07-02 NOTE — Progress Notes (Signed)
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  Being sent for molecular genetic testing, AFIRMA.  This will take approximately 2 weeks to receive an answer.  Scheduling for parathyroid surgery is on hold til then.  We will contact you with the results when they are available and make further plans at that time.  [place], MD [facility] [place] [facility] Office: [phone]

## 2021-07-16 ENCOUNTER — Encounter (HOSPITAL_COMMUNITY): Payer: Self-pay

## 2021-07-18 ENCOUNTER — Other Ambulatory Visit: Payer: Self-pay | Admitting: Internal Medicine

## 2021-07-20 ENCOUNTER — Ambulatory Visit: Payer: Self-pay | Admitting: Surgery

## 2021-07-26 ENCOUNTER — Other Ambulatory Visit: Payer: Self-pay

## 2021-07-26 ENCOUNTER — Encounter: Payer: Self-pay | Admitting: Internal Medicine

## 2021-07-26 ENCOUNTER — Ambulatory Visit (INDEPENDENT_AMBULATORY_CARE_PROVIDER_SITE_OTHER): Payer: Medicare Other | Admitting: Internal Medicine

## 2021-07-26 VITALS — BP 128/78 | HR 60 | Ht 72.0 in | Wt 206.2 lb

## 2021-07-26 DIAGNOSIS — E210 Primary hyperparathyroidism: Secondary | ICD-10-CM | POA: Diagnosis not present

## 2021-07-26 DIAGNOSIS — E041 Nontoxic single thyroid nodule: Secondary | ICD-10-CM | POA: Diagnosis not present

## 2021-07-26 DIAGNOSIS — E559 Vitamin D deficiency, unspecified: Secondary | ICD-10-CM
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
## 2021-07-26 NOTE — Patient Instructions (Addendum)
Please ask Dr. Laurann Montana to check a TSH.  Please come back for a follow-up appointment in 4 months.

## 2021-07-27 DIAGNOSIS — Z0000 Encounter for general adult medical examination without abnormal findings: Secondary | ICD-10-CM | POA: Diagnosis not present

## 2021-07-27 DIAGNOSIS — Z86010 Personal history of colonic polyps: Secondary | ICD-10-CM | POA: Diagnosis not present

## 2021-07-27 DIAGNOSIS — Z1389 Encounter for screening for other disorder: Secondary | ICD-10-CM | POA: Diagnosis not present

## 2021-07-27 DIAGNOSIS — N529 Male erectile dysfunction, unspecified: Secondary | ICD-10-CM | POA: Diagnosis not present

## 2021-07-27 DIAGNOSIS — E041 Nontoxic single thyroid nodule: Secondary | ICD-10-CM | POA: Diagnosis not present

## 2021-07-27 DIAGNOSIS — M21371 Foot drop, right foot: Secondary | ICD-10-CM | POA: Diagnosis not present

## 2021-07-27 DIAGNOSIS — I10 Essential (primary) hypertension: Secondary | ICD-10-CM | POA: Diagnosis not present

## 2021-07-27 DIAGNOSIS — J309 Allergic rhinitis, unspecified: Secondary | ICD-10-CM | POA: Diagnosis not present

## 2021-07-27 DIAGNOSIS — E7800 Pure hypercholesterolemia, unspecified: Secondary | ICD-10-CM | POA: Diagnosis not present

## 2021-07-27 DIAGNOSIS — E213 Hyperparathyroidism, unspecified: Secondary | ICD-10-CM | POA: Diagnosis not present

## 2021-07-27 DIAGNOSIS — M1990 Unspecified osteoarthritis, unspecified site: Secondary | ICD-10-CM | POA: Diagnosis not present

## 2021-08-12 NOTE — Progress Notes (Signed)
DUE TO COVID-19 ONLY ONE VISITOR IS ALLOWED TO COME WITH YOU AND STAY IN THE WAITING ROOM ONLY DURING PRE OP AND PROCEDURE DAY OF SURGERY.  2 VISITOR  MAY VISIT WITH YOU AFTER SURGERY IN YOUR PRIVATE ROOM DURING VISITING HOURS ONLY!  YOU NEED TO HAVE A COVID 19 TEST ON_  _9/26/2022 '@_'$  '@_from'$  8am-3pm _____, THIS TEST MUST BE DONE BEFORE SURGERY,  Covid test is done at [place], [place] [address].  This is a drive thru.  No appt required. Please see map.                 Your procedure is scheduled on:  08/26/2021   Report to [facility] Main  Entrance   Report to admitting at    [phone]     Call this number if you have problems the morning of surgery [phone]    REMEMBER: NO  SOLID FOOD CANDY OR GUM AFTER MIDNIGHT. CLEAR LIQUIDS UNTIL  0430am        . NOTHING BY MOUTH EXCEPT CLEAR LIQUIDS UNTIL 0430am    . PLEASE FINISH ENSURE DRINK PER SURGEON ORDER  WHICH NEEDS TO BE COMPLETED AT  0430am    .      CLEAR LIQUID DIET   Foods Allowed                                                                    Coffee and tea, regular and decaf                            Fruit ices (not with fruit pulp)                                      Iced Popsicles                                    Carbonated beverages, regular and diet                                    Cranberry, grape and apple juices Sports drinks like Gatorade Lightly seasoned clear broth or consume(fat free) Sugar, honey syrup ___________________________________________________________________      BRUSH YOUR TEETH MORNING OF SURGERY AND RINSE YOUR MOUTH OUT, NO CHEWING GUM CANDY OR MINTS.     Take these medicines the morning of surgery with A SIP OF WATER: Bisoprolol   DO NOT TAKE ANY DIABETIC MEDICATIONS DAY OF YOUR SURGERY                               You may not have any metal on your body including hair pins and              piercings  Do not wear jewelry, make-up, lotions, powders or  perfumes, deodorant             Do not wear nail  polish on your fingernails.  Do not shave  48 hours prior to surgery.              Men may shave face and neck.   Do not bring valuables to the hospital. [place].  Contacts, dentures or bridgework may not be worn into surgery.  Leave suitcase in the car. After surgery it may be brought to your room.     Patients discharged the day of surgery will not be allowed to drive home. IF YOU ARE HAVING SURGERY AND GOING HOME THE SAME DAY, YOU MUST HAVE AN ADULT TO DRIVE YOU HOME AND BE WITH YOU FOR 24 HOURS. YOU MAY GO HOME BY TAXI OR UBER OR ORTHERWISE, BUT AN ADULT MUST ACCOMPANY YOU HOME AND STAY WITH YOU FOR 24 HOURS.  Name and phone number of your driver:  Special Instructions: N/A              Please read over the following fact sheets you were given: _____________________________________________________________________  [facility] - Preparing for Surgery Before surgery, you can play an important role.  Because skin is not sterile, your skin needs to be as free of germs as possible.  You can reduce the number of germs on your skin by washing with CHG (chlorahexidine gluconate) soap before surgery.  CHG is an antiseptic cleaner which kills germs and bonds with the skin to continue killing germs even after washing. Please DO NOT use if you have an allergy to CHG or antibacterial soaps.  If your skin becomes reddened/irritated stop using the CHG and inform your nurse when you arrive at Short Stay. Do not shave (including legs and underarms) for at least 48 hours prior to the first CHG shower.  You may shave your face/neck. Please follow these instructions carefully:  1.  Shower with CHG Soap the night before surgery and the  morning of Surgery.  2.  If you choose to wash your hair, wash your hair first as usual with your  normal  shampoo.  3.  After you shampoo, rinse your hair and body thoroughly  to remove the  shampoo.                           4.  Use CHG as you would any other liquid soap.  You can apply chg directly  to the skin and wash                       Gently with a scrungie or clean washcloth.  5.  Apply the CHG Soap to your body ONLY FROM THE NECK DOWN.   Do not use on face/ open                           Wound or open sores. Avoid contact with eyes, ears mouth and genitals (private parts).                       Wash face,  Genitals (private parts) with your normal soap.             6.  Wash thoroughly, paying special attention to the area where your surgery  will be performed.  7.  Thoroughly rinse your body with warm water from the  neck down.  8.  DO NOT shower/wash with your normal soap after using and rinsing off  the CHG Soap.                9.  Pat yourself dry with a clean towel.            10.  Wear clean pajamas.            11.  Place clean sheets on your bed the night of your first shower and do not  sleep with pets. Day of Surgery : Do not apply any lotions/deodorants the morning of surgery.  Please wear clean clothes to the hospital/surgery center.  FAILURE TO FOLLOW THESE INSTRUCTIONS MAY RESULT IN THE CANCELLATION OF YOUR SURGERY PATIENT SIGNATURE_________________________________  NURSE SIGNATURE__________________________________  ________________________________________________________________________

## 2021-08-16 ENCOUNTER — Encounter (HOSPITAL_COMMUNITY)
Admission: RE | Admit: 2021-08-16 | Discharge: 2021-08-16 | Disposition: A | Discharge status: Home / Self Care | Payer: Medicare Other | Source: Ambulatory Visit | Attending: Surgery | Admitting: Surgery

## 2021-08-16 ENCOUNTER — Other Ambulatory Visit: Payer: Self-pay

## 2021-08-16 ENCOUNTER — Encounter (HOSPITAL_COMMUNITY): Payer: Self-pay

## 2021-08-16 ENCOUNTER — Ambulatory Visit (HOSPITAL_COMMUNITY)
Admission: RE | Admit: 2021-08-16 | Discharge: 2021-08-16 | Disposition: A | Discharge status: Home / Self Care | Payer: Medicare Other | Source: Ambulatory Visit | Attending: Anesthesiology | Admitting: Anesthesiology

## 2021-08-16 DIAGNOSIS — Z01818 Encounter for other preprocedural examination: Secondary | ICD-10-CM | POA: Diagnosis not present

## 2021-08-16 HISTORY — DX: Unspecified osteoarthritis, unspecified site: M19.90

## 2021-08-16 HISTORY — DX: Other specified postprocedural states: Z98.890

## 2021-08-16 HISTORY — DX: Pneumonia, unspecified organism: J18.9

## 2021-08-16 HISTORY — DX: Gastro-esophageal reflux disease without esophagitis: K21.9

## 2021-08-16 HISTORY — DX: Personal history of urinary calculi: Z87.442

## 2021-08-16 HISTORY — DX: Nausea with vomiting, unspecified: R11.2

## 2021-08-16 LAB — BASIC METABOLIC PANEL
Anion gap: 9 (ref 5–15)
BUN: 21 mg/dL (ref 8–23)
CO2: 21 mmol/L — ABNORMAL LOW (ref 22–32)
Calcium: 10.2 mg/dL (ref 8.9–10.3)
Chloride: 109 mmol/L (ref 98–111)
Creatinine, Ser: 1.25 mg/dL — ABNORMAL HIGH (ref 0.61–1.24)
GFR, Estimated: 59 mL/min — ABNORMAL LOW (ref >60)
Glucose, Bld: 159 mg/dL — ABNORMAL HIGH (ref 70–99)
Potassium: 4.1 mmol/L (ref 3.5–5.1)
Sodium: 139 mmol/L (ref 135–145)

## 2021-08-16 LAB — CBC
HCT: 42.8 % (ref 39.0–52.0)
Hemoglobin: 14.8 g/dL (ref 13.0–17.0)
MCH: 32 pg (ref 26.0–34.0)
MCHC: 34.6 g/dL (ref 30.0–36.0)
MCV: 92.6 fL (ref 80.0–100.0)
Platelets: 163 10*3/uL (ref 150–400)
RBC: 4.62 MIL/uL (ref 4.22–5.81)
RDW: 13.2 % (ref 11.5–15.5)
WBC: 7.1 10*3/uL (ref 4.0–10.5)
nRBC: 0 % (ref 0.0–0.2)

## 2021-08-16 NOTE — Progress Notes (Addendum)
Anesthesia Review:  PCP: 07/26/21- Dewayne Shorter- PCP - Requested LOV note.  Dated 07/27/21 on chart..  Cardiologist : Chest x-ray :08/16/21  EKG : 08/16/21 Echo : Stress test: Cardiac Cath :  Activity level: can do a  flight of stairs without difficulty  Sleep Study/ CPAP : none  Fasting Blood Sugar :      / Checks Blood Sugar -- times a day:   Blood Thinner/ Instructions /Last Dose: ASA / Instructions/ Last Dose :   Covid test- 08/23/21  Pt reports sometimes takes Bisoprolol in the am or in the pm.  PT reported today that he took Bisoprolol in the am.  Informed pt to take at same time every day in the am until surgery and to take in the am morning of surgery.  Pt voiced understanding.

## 2021-08-23 ENCOUNTER — Other Ambulatory Visit: Payer: Self-pay | Admitting: Surgery

## 2021-08-23 ENCOUNTER — Encounter (HOSPITAL_COMMUNITY): Payer: Self-pay | Admitting: Surgery

## 2021-08-23 DIAGNOSIS — E210 Primary hyperparathyroidism: Secondary | ICD-10-CM | POA: Diagnosis present

## 2021-08-23 LAB — SARS CORONAVIRUS 2 (TAT 6-24 HRS): SARS Coronavirus 2: NEGATIVE
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
## 2021-08-25 NOTE — Anesthesia Preprocedure Evaluation (Addendum)
Anesthesia Evaluation  Patient identified by MRN, date of birth, ID band Patient awake    Reviewed: Allergy & Precautions, NPO status , Patient's Chart, lab work & pertinent test results, reviewed documented beta blocker date and time   History of Anesthesia Complications (+) PONV and history of anesthetic complications  Airway Mallampati: II  TM Distance: >3 FB Neck ROM: Full    Dental  (+) Implants, Dental Advisory Given   Pulmonary neg pulmonary ROS, former smoker,    Pulmonary exam normal breath sounds clear to auscultation       Cardiovascular hypertension, Pt. on home beta blockers and Pt. on medications Normal cardiovascular exam Rhythm:Regular Rate:Normal     Neuro/Psych negative neurological ROS     GI/Hepatic Neg liver ROS, GERD  ,  Endo/Other  negative endocrine ROS  Renal/GU negative Renal ROS     Musculoskeletal  (+) Arthritis ,   Abdominal   Peds  Hematology negative hematology ROS (+)   Anesthesia Other Findings   Reproductive/Obstetrics                            Anesthesia Physical Anesthesia Plan  ASA: 2  Anesthesia Plan: General   Post-op Pain Management:    Induction: Intravenous  PONV Risk Score and Plan: 4 or greater and Ondansetron, Dexamethasone, Midazolam, Treatment may vary due to age or medical condition and Diphenhydramine  Airway Management Planned: Oral ETT  Additional Equipment: None  Intra-op Plan:   Post-operative Plan: Extubation in OR  Informed Consent: I have reviewed the patients History and Physical, chart, labs and discussed the procedure including the risks, benefits and alternatives for the proposed anesthesia with the patient or authorized representative who has indicated his/her understanding and acceptance.     Dental advisory given  Plan Discussed with: CRNA  Anesthesia Plan Comments:        Anesthesia Quick  Evaluation

## 2021-08-26 ENCOUNTER — Ambulatory Visit (HOSPITAL_COMMUNITY): Payer: Medicare Other | Admitting: Certified Registered Nurse Anesthetist

## 2021-08-26 ENCOUNTER — Encounter (HOSPITAL_COMMUNITY)
Admission: RE | Disposition: A | Discharge status: Home / Self Care | Payer: Self-pay | Source: Ambulatory Visit | Attending: Surgery

## 2021-08-26 ENCOUNTER — Encounter (HOSPITAL_COMMUNITY): Payer: Self-pay | Admitting: Surgery

## 2021-08-26 ENCOUNTER — Ambulatory Visit (HOSPITAL_COMMUNITY): Payer: Medicare Other | Admitting: Physician Assistant

## 2021-08-26 ENCOUNTER — Ambulatory Visit (HOSPITAL_COMMUNITY)
Admission: RE | Admit: 2021-08-26 | Discharge: 2021-08-26 | Disposition: A | Discharge status: Home / Self Care | Payer: Medicare Other | Source: Ambulatory Visit | Attending: Surgery | Admitting: Surgery

## 2021-08-26 DIAGNOSIS — Z79899 Other long term (current) drug therapy: Secondary | ICD-10-CM | POA: Insufficient documentation

## 2021-08-26 DIAGNOSIS — D351 Benign neoplasm of parathyroid gland: Secondary | ICD-10-CM | POA: Insufficient documentation

## 2021-08-26 DIAGNOSIS — Z87891 Personal history of nicotine dependence: Secondary | ICD-10-CM | POA: Insufficient documentation

## 2021-08-26 DIAGNOSIS — Z87442 Personal history of urinary calculi: Secondary | ICD-10-CM | POA: Insufficient documentation

## 2021-08-26 DIAGNOSIS — I10 Essential (primary) hypertension: Secondary | ICD-10-CM | POA: Diagnosis not present

## 2021-08-26 DIAGNOSIS — E210 Primary hyperparathyroidism: Secondary | ICD-10-CM | POA: Diagnosis not present

## 2021-08-26 DIAGNOSIS — K219 Gastro-esophageal reflux disease without esophagitis: Secondary | ICD-10-CM | POA: Diagnosis not present

## 2021-08-26 DIAGNOSIS — J189 Pneumonia, unspecified organism: Secondary | ICD-10-CM | POA: Diagnosis not present

## 2021-08-26 DIAGNOSIS — E213 Hyperparathyroidism, unspecified: Secondary | ICD-10-CM | POA: Diagnosis not present

## 2021-08-26 HISTORY — PX: PARATHYROIDECTOMY: SHX19

## 2021-08-26 SURGERY — PARATHYROIDECTOMY
Anesthesia: General | Site: Neck | Laterality: Left

## 2021-08-26 MED ORDER — ROCURONIUM BROMIDE 10 MG/ML (PF) SYRINGE
PREFILLED_SYRINGE | INTRAVENOUS | Status: AC
  Filled 2021-08-26: qty 10

## 2021-08-26 MED ORDER — DIPHENHYDRAMINE HCL 50 MG/ML IJ SOLN
INTRAMUSCULAR | Status: AC
  Filled 2021-08-26: qty 1

## 2021-08-26 MED ORDER — LIDOCAINE HCL (PF) 2 % IJ SOLN
INTRAMUSCULAR | Status: AC
  Filled 2021-08-26: qty 5

## 2021-08-26 MED ORDER — LACTATED RINGERS IV SOLN: INTRAVENOUS | Status: DC

## 2021-08-26 MED ORDER — DEXAMETHASONE SODIUM PHOSPHATE 4 MG/ML IJ SOLN
INTRAMUSCULAR | Status: DC | PRN
  Administered 2021-08-26: 10 mg via INTRAVENOUS

## 2021-08-26 MED ORDER — FENTANYL CITRATE PF 50 MCG/ML IJ SOSY: 25.0000 ug | PREFILLED_SYRINGE | INTRAMUSCULAR | Status: DC | PRN

## 2021-08-26 MED ORDER — BUPIVACAINE HCL 0.25 % IJ SOLN
INTRAMUSCULAR | Status: DC | PRN
  Administered 2021-08-26: 8 mL

## 2021-08-26 MED ORDER — 0.9 % SODIUM CHLORIDE (POUR BTL) OPTIME
TOPICAL | Status: DC | PRN
  Administered 2021-08-26: 1000 mL

## 2021-08-26 MED ORDER — ONDANSETRON HCL 4 MG/2ML IJ SOLN
INTRAMUSCULAR | Status: DC | PRN
  Administered 2021-08-26: 4 mg via INTRAVENOUS

## 2021-08-26 MED ORDER — MEPERIDINE HCL 50 MG/ML IJ SOLN: 6.2500 mg | INTRAMUSCULAR | Status: DC | PRN

## 2021-08-26 MED ORDER — MIDAZOLAM HCL 2 MG/2ML IJ SOLN
INTRAMUSCULAR | Status: AC
  Filled 2021-08-26: qty 2

## 2021-08-26 MED ORDER — DEXAMETHASONE SODIUM PHOSPHATE 10 MG/ML IJ SOLN
INTRAMUSCULAR | Status: AC
  Filled 2021-08-26: qty 1

## 2021-08-26 MED ORDER — ROCURONIUM BROMIDE 10 MG/ML (PF) SYRINGE
PREFILLED_SYRINGE | INTRAVENOUS | Status: DC | PRN
  Administered 2021-08-26: 60 mg via INTRAVENOUS

## 2021-08-26 MED ORDER — MIDAZOLAM HCL 5 MG/5ML IJ SOLN
INTRAMUSCULAR | Status: DC | PRN
  Administered 2021-08-26: 2 mg via INTRAVENOUS

## 2021-08-26 MED ORDER — BUPIVACAINE HCL 0.25 % IJ SOLN
INTRAMUSCULAR | Status: AC
  Filled 2021-08-26: qty 1

## 2021-08-26 MED ORDER — SUGAMMADEX SODIUM 200 MG/2ML IV SOLN
INTRAVENOUS | Status: DC | PRN
  Administered 2021-08-26: 200 mg via INTRAVENOUS

## 2021-08-26 MED ORDER — PROPOFOL 10 MG/ML IV BOLUS
INTRAVENOUS | Status: AC
  Filled 2021-08-26: qty 20

## 2021-08-26 MED ORDER — DIPHENHYDRAMINE HCL 50 MG/ML IJ SOLN
INTRAMUSCULAR | Status: DC | PRN
  Administered 2021-08-26: 12.5 mg via INTRAVENOUS

## 2021-08-26 MED ORDER — HEMOSTATIC AGENTS (NO CHARGE) OPTIME
TOPICAL | Status: DC | PRN
  Administered 2021-08-26: 1 via TOPICAL

## 2021-08-26 MED ORDER — FENTANYL CITRATE (PF) 100 MCG/2ML IJ SOLN
INTRAMUSCULAR | Status: DC | PRN
  Administered 2021-08-26 (×2): 50 ug via INTRAVENOUS

## 2021-08-26 MED ORDER — CHLORHEXIDINE GLUCONATE CLOTH 2 % EX PADS: 6.0000 | MEDICATED_PAD | Freq: Once | CUTANEOUS | Status: DC

## 2021-08-26 MED ORDER — OXYCODONE HCL 5 MG PO TABS
5.0000 mg | ORAL_TABLET | Freq: Once | ORAL | Status: AC
  Administered 2021-08-26: 5 mg via ORAL

## 2021-08-26 MED ORDER — AMISULPRIDE (ANTIEMETIC) 5 MG/2ML IV SOLN: 10.0000 mg | Freq: Once | INTRAVENOUS | Status: DC | PRN

## 2021-08-26 MED ORDER — PROPOFOL 10 MG/ML IV BOLUS
INTRAVENOUS | Status: DC | PRN
  Administered 2021-08-26: 150 mg via INTRAVENOUS

## 2021-08-26 MED ORDER — OXYCODONE HCL 5 MG PO TABS
ORAL_TABLET | ORAL | Status: AC
  Filled 2021-08-26: qty 1

## 2021-08-26 MED ORDER — CEFAZOLIN SODIUM-DEXTROSE 2-4 GM/100ML-% IV SOLN
2.0000 g | INTRAVENOUS | Status: AC
  Administered 2021-08-26: 2 g via INTRAVENOUS
  Filled 2021-08-26: qty 100

## 2021-08-26 MED ORDER — ONDANSETRON HCL 4 MG/2ML IJ SOLN
INTRAMUSCULAR | Status: AC
  Filled 2021-08-26: qty 2

## 2021-08-26 MED ORDER — TRAMADOL HCL 50 MG PO TABS: 50.0000 mg | ORAL_TABLET | Freq: Four times a day (QID) | ORAL | 0 refills | Status: DC | PRN

## 2021-08-26 MED ORDER — FENTANYL CITRATE (PF) 100 MCG/2ML IJ SOLN
INTRAMUSCULAR | Status: AC
  Filled 2021-08-26: qty 2

## 2021-08-26 MED ORDER — ORAL CARE MOUTH RINSE: 15.0000 mL | Freq: Once | OROMUCOSAL | Status: AC

## 2021-08-26 MED ORDER — EPHEDRINE 5 MG/ML INJ
INTRAVENOUS | Status: AC
  Filled 2021-08-26: qty 5

## 2021-08-26 MED ORDER — CHLORHEXIDINE GLUCONATE 0.12 % MT SOLN
15.0000 mL | Freq: Once | OROMUCOSAL | Status: AC
  Administered 2021-08-26: 15 mL via OROMUCOSAL

## 2021-08-26 MED ORDER — EPHEDRINE SULFATE-NACL 50-0.9 MG/10ML-% IV SOSY
PREFILLED_SYRINGE | INTRAVENOUS | Status: DC | PRN
  Administered 2021-08-26: 10 mg via INTRAVENOUS

## 2021-08-26 MED ORDER — LIDOCAINE 2% (20 MG/ML) 5 ML SYRINGE
INTRAMUSCULAR | Status: DC | PRN
  Administered 2021-08-26: 80 mg via INTRAVENOUS

## 2021-08-26 SURGICAL SUPPLY — 36 items
ADH SKN CLS APL DERMABOND .7 (GAUZE/BANDAGES/DRESSINGS) ×1
APL PRP STRL LF DISP 70% ISPRP (MISCELLANEOUS) ×1
ATTRACTOMAT 16X20 MAGNETIC DRP (DRAPES) ×2 IMPLANT
BAG COUNTER SPONGE SURGICOUNT (BAG) ×1 IMPLANT
BAG SPNG CNTER NS LX DISP (BAG)
BLADE SURG 15 STRL LF DISP TIS (BLADE) ×1 IMPLANT
BLADE SURG 15 STRL SS (BLADE) ×2
CHLORAPREP W/TINT 26 (MISCELLANEOUS) ×2 IMPLANT
CLIP TI MEDIUM 6 (CLIP) ×3 IMPLANT
CLIP TI WIDE RED SMALL 6 (CLIP) ×4 IMPLANT
COVER SURGICAL LIGHT HANDLE (MISCELLANEOUS) ×2 IMPLANT
DERMABOND ADVANCED (GAUZE/BANDAGES/DRESSINGS) ×1
DERMABOND ADVANCED .7 DNX12 (GAUZE/BANDAGES/DRESSINGS) ×1 IMPLANT
DRAPE LAPAROTOMY T 98X78 PEDS (DRAPES) ×2 IMPLANT
DRAPE UTILITY XL STRL (DRAPES) ×2 IMPLANT
ELECT REM PT RETURN 15FT ADLT (MISCELLANEOUS) ×2 IMPLANT
GAUZE 4X4 16PLY ~~LOC~~+RFID DBL (SPONGE) ×2 IMPLANT
GLOVE SURG SYN 7.5  E (GLOVE) ×4
GLOVE SURG SYN 7.5 E (GLOVE) ×2 IMPLANT
GLOVE SURG SYN 7.5 PF PI (GLOVE) ×2 IMPLANT
GOWN STRL REUS W/TWL XL LVL3 (GOWN DISPOSABLE) ×6 IMPLANT
HEMOSTAT SURGICEL 2X4 FIBR (HEMOSTASIS) ×2 IMPLANT
ILLUMINATOR WAVEGUIDE N/F (MISCELLANEOUS) IMPLANT
KIT BASIN OR (CUSTOM PROCEDURE TRAY) ×2 IMPLANT
KIT TURNOVER KIT A (KITS) ×2 IMPLANT
NDL HYPO 25X1 1.5 SAFETY (NEEDLE) ×1 IMPLANT
NEEDLE HYPO 25X1 1.5 SAFETY (NEEDLE) ×2 IMPLANT
PACK BASIC VI WITH GOWN DISP (CUSTOM PROCEDURE TRAY) ×2 IMPLANT
PENCIL SMOKE EVACUATOR (MISCELLANEOUS) ×2 IMPLANT
SUT MNCRL AB 4-0 PS2 18 (SUTURE) ×2 IMPLANT
SUT VIC AB 3-0 SH 18 (SUTURE) ×2 IMPLANT
SYR BULB IRRIG 60ML STRL (SYRINGE) ×2 IMPLANT
SYR CONTROL 10ML LL (SYRINGE) ×2 IMPLANT
TOWEL OR 17X26 10 PK STRL BLUE (TOWEL DISPOSABLE) ×2 IMPLANT
TOWEL OR NON WOVEN STRL DISP B (DISPOSABLE) ×2 IMPLANT
TUBING CONNECTING 10 (TUBING) ×2 IMPLANT

## 2021-08-26 NOTE — Transfer of Care (Signed)
Immediate Anesthesia Transfer of Care Note  Patient: Craig [name]  Procedure(s) Performed: LEFT PARATHYROIDECTOMY (Left: Neck)  Patient Location: PACU  Anesthesia Type:General  Level of Consciousness: awake and patient cooperative  Airway & Oxygen Therapy: Patient Spontanous Breathing and Patient connected to face mask  Post-op Assessment: Report given to RN and Post -op Vital signs reviewed and stable  Post vital signs: Reviewed and stable  Last Vitals:  Vitals Value Taken Time  BP 146/88 08/26/21 0848  Temp    Pulse 67 08/26/21 0849  Resp 13 08/26/21 0849  SpO2 100 % 08/26/21 0849  Vitals shown include unvalidated device data.  Last Pain:  Vitals:   08/26/21 0609  TempSrc: Oral  PainSc:          Complications: No notable events documented.

## 2021-08-26 NOTE — Op Note (Signed)
OPERATIVE REPORT - PARATHYROIDECTOMY  Preoperative diagnosis: Primary hyperparathyroidism  Postop diagnosis: Same  Procedure: Left superior minimally invasive parathyroidectomy  Surgeon:  Armandina Gemma, MD  Anesthesia: General endotracheal  Estimated blood loss: Minimal  Preparation: ChloraPrep  Indications: Patient is referred by Dr. Philemon Kingdom for surgical evaluation and management of suspected primary hyperparathyroidism.  Patient's primary care physician is Dr. Kelton Pillar.  Patient has been noted with elevated serum calcium levels dating back several years.  Calcium levels have ranged from 10.6-11.2.  Intact PTH level is inappropriately not suppressed at 33.  24-hour urine collection for calcium was normal at 191.  Vitamin D level was normal at 38.  Patient has had a history of nephrolithiasis. Nuclear med parathyroid scan localized a left superior adenoma.  Patient now comes to surgery for parathyroidectomy.  Procedure: The patient was prepared in the pre-operative holding area. The patient was brought to the operating room and placed in a supine position on the operating room table. Following administration of general anesthesia, the patient was positioned and then prepped and draped in the usual strict aseptic fashion. After ascertaining that an adequate level of anesthesia been achieved, a neck incision was made with a #15 blade. Dissection was carried through subcutaneous tissues and platysma. Hemostasis was obtained with the electrocautery. Skin flaps were developed circumferentially and a Weitlander retractor was placed for exposure.  Strap muscles were incised in the midline. Strap muscles were reflected laterally exposing the thyroid lobe. With gentle blunt dissection the thyroid lobe was mobilized.  Dissection was carried posteriorly and an enlarged parathyroid gland was identified. It was gently mobilized. Vascular structures were divided between small and medium ligaclips.  Care was taken to avoid the recurrent laryngeal nerve. The parathyroid gland was completely excised. It was submitted to pathology where frozen section confirmed hypercellular parathyroid tissue consistent with adenoma. A second glandular structure was identified immediately adjacent to the first and resected.  This may represent a "lobe" of the gland or else an inferior parathyroid gland.  Frozen showed mild hypercellularity.  Neck was irrigated with warm saline and good hemostasis was noted. Fibrillar was placed in the operative field. Strap muscles were approximated in the midline with interrupted 3-0 Vicryl sutures. Platysma was closed with interrupted 3-0 Vicryl sutures. Marcaine was infiltrated circumferentially. Skin was closed with a running 4-0 Monocryl subcuticular suture. Wound was washed and dried and Dermabond was applied. Patient was awakened from anesthesia and brought to the recovery room. The patient tolerated the procedure well.   Armandina Gemma, MD [facility] Office: [phone]

## 2021-08-26 NOTE — Anesthesia Procedure Notes (Signed)
Procedure Name: Intubation Date/Time: 08/26/2021 7:40 AM Performed by: Claudia Desanctis, CRNA Pre-anesthesia Checklist: Patient identified, Emergency Drugs available, Suction available and Patient being monitored Patient Re-evaluated:Patient Re-evaluated prior to induction Oxygen Delivery Method: Circle system utilized Preoxygenation: Pre-oxygenation with 100% oxygen Induction Type: IV induction Ventilation: Mask ventilation without difficulty Laryngoscope Size: 2 and Miller Grade View: Grade I Tube type: Oral Tube size: 7.5 mm Number of attempts: 1 Airway Equipment and Method: Stylet Placement Confirmation: ETT inserted through vocal cords under direct vision, positive ETCO2 and breath sounds checked- equal and bilateral Secured at: 21 cm Tube secured with: Tape Dental Injury: Teeth and Oropharynx as per pre-operative assessment

## 2021-08-26 NOTE — Interval H&P Note (Signed)
History and Physical Interval Note:  08/26/2021 7:15 AM  Craig [name]  has presented today for surgery, with the diagnosis of PRIMARY HYPERPARATHYROIDISM.  The various methods of treatment have been discussed with the patient and family. After consideration of risks, benefits and other options for treatment, the patient has consented to    Procedure(s): LEFT PARATHYROIDECTOMY (Left) as a surgical intervention.    The patient's history has been reviewed, patient examined, no change in status, stable for surgery.  I have reviewed the patient's chart and labs.  Questions were answered to the patient's satisfaction.    Armandina Gemma, [facility] [place] practice Office: [place]

## 2021-08-26 NOTE — Discharge Instructions (Addendum)
[facility] - Dr. Todd Gerkin  THYROID & PARATHYROID SURGERY:  POST-OP INSTRUCTIONS  Always review the instruction sheet provided by the hospital nurse at discharge.  A prescription for pain medication may be sent to your pharmacy at the time of discharge.  Take your pain medication as prescribed.  If narcotic pain medicine is not needed, then you may take acetaminophen (Tylenol) or ibuprofen (Advil) as needed for pain or soreness.  Take your normal home medications as prescribed unless otherwise directed.  If you need a refill on your pain medication, please contact the office during regular business hours.  Prescriptions will not be processed by the office after 5:00PM or on weekends.  Start with a light diet upon arrival home, such as soup and crackers or toast.  Be sure to drink plenty of fluids.  Resume your normal diet the day after surgery.  Most patients will experience some swelling and bruising on the chest and neck area.  Ice packs will help for the first 48 hours after arriving home.  Swelling and bruising will take several days to resolve.   It is common to experience some constipation after surgery.  Increasing fluid intake and taking a stool softener (Colace) will usually help to prevent this problem.  A mild laxative (Milk of Magnesia or Miralax) should be taken according to package directions if there has been no bowel movement after 48 hours.  Dermabond glue covers your incision. This seals the wound and you may shower at any time. The Dermabond will remain in place for about a week.  You may gradually remove the glue when it loosens around the edges.  If you need to loosen the Dermabond for removal, apply a layer of Vaseline to the wound for 15 minutes and then remove with a Kleenex. Your sutures are under the skin and will not show - they will dissolve on their own.  You may resume light daily activities beginning the day after discharge (such as self-care,  walking, climbing stairs), gradually increasing activities as tolerated. You may have sexual intercourse when it is comfortable. Refrain from any heavy lifting or straining until approved by your doctor. You may drive when you no longer are taking prescription pain medication, you can comfortably wear a seatbelt, and you can safely maneuver your car and apply the brakes.  You will see your doctor in the office for a follow-up appointment approximately three weeks after your surgery.  Make sure that you call for this appointment within a day or two after you arrive home to insure a convenient appointment time. Please have any requested laboratory tests performed a few days prior to your office visit so that the results will be available at your follow up appointment.  WHEN TO CALL THE [facility] OFFICE: -- Fever greater than 101.5 -- Inability to urinate -- Nausea and/or vomiting - persistent -- Extreme swelling or bruising -- Continued bleeding from incision -- Increased pain, redness, or drainage from the incision -- Difficulty swallowing or breathing -- Muscle cramping or spasms -- Numbness or tingling in hands or around lips  The clinic staff is available to answer your questions during regular business hours.  Please don't hesitate to call and ask to speak to one of the nurses if you have concerns.  [facility] OFFICE: [phone] (24 hours)  Please sign up for MyChart accounts. This will allow you to communicate directly with my nurse or myself without having to call the office. It will also allow you   to view your test results. You will need to enroll in MyChart for my office ([facility]) and for the hospital ([place]).  Todd Gerkin, MD [facility] practice 

## 2021-08-27 ENCOUNTER — Encounter (HOSPITAL_COMMUNITY): Payer: Self-pay | Admitting: Surgery

## 2021-08-27 LAB — SURGICAL PATHOLOGY

## 2021-08-27 NOTE — Anesthesia Postprocedure Evaluation (Signed)
Anesthesia Post Note  Patient: Craig [name]  Procedure(s) Performed: LEFT PARATHYROIDECTOMY (Left: Neck)     Patient location during evaluation: PACU Anesthesia Type: General Level of consciousness: sedated and patient cooperative Pain management: pain level controlled Vital Signs Assessment: post-procedure vital signs reviewed and stable Respiratory status: spontaneous breathing Cardiovascular status: stable Anesthetic complications: no   No notable events documented.  Last Vitals:  Vitals:   08/26/21 0915 08/26/21 0945  BP: (!) 144/91 (!) 152/93  Pulse: (!) 59 (!) 56  Resp: 14 16  Temp: (!) 36.3 C   SpO2: 99% 98%    Last Pain:  Vitals:   08/26/21 0945  TempSrc:   PainSc: [place]

## 2021-09-01 ENCOUNTER — Other Ambulatory Visit: Payer: Self-pay | Admitting: Internal Medicine

## 2021-09-06 DIAGNOSIS — E892 Postprocedural hypoparathyroidism: Secondary | ICD-10-CM | POA: Diagnosis not present

## 2021-09-06 DIAGNOSIS — Z23 Encounter for immunization: Secondary | ICD-10-CM | POA: Diagnosis not present

## 2021-11-05 DIAGNOSIS — U071 COVID-19: Secondary | ICD-10-CM | POA: Diagnosis not present

## 2021-11-06 DIAGNOSIS — U071 COVID-19: Secondary | ICD-10-CM | POA: Diagnosis not present

## 2021-11-19 DIAGNOSIS — J40 Bronchitis, not specified as acute or chronic: Secondary | ICD-10-CM | POA: Diagnosis not present

## 2021-11-19 DIAGNOSIS — U071 COVID-19: Secondary | ICD-10-CM | POA: Diagnosis not present

## 2021-11-25 ENCOUNTER — Encounter: Payer: Self-pay | Admitting: Internal Medicine

## 2021-11-25 ENCOUNTER — Ambulatory Visit (INDEPENDENT_AMBULATORY_CARE_PROVIDER_SITE_OTHER): Payer: Medicare Other | Admitting: Internal Medicine

## 2021-11-25 ENCOUNTER — Other Ambulatory Visit: Payer: Self-pay

## 2021-11-25 VITALS — BP 118/78 | Ht 72.0 in | Wt 212.0 lb

## 2021-11-25 DIAGNOSIS — E559 Vitamin D deficiency, unspecified: Secondary | ICD-10-CM

## 2021-11-25 DIAGNOSIS — E210 Primary hyperparathyroidism: Secondary | ICD-10-CM | POA: Diagnosis not present

## 2021-11-25 DIAGNOSIS — E041 Nontoxic single thyroid nodule: Secondary | ICD-10-CM | POA: Diagnosis not present

## 2021-11-25 LAB — TSH: TSH: 1.86 u[IU]/mL (ref 0.35–5.50)

## 2021-11-25 LAB — VITAMIN D 25 HYDROXY (VIT D DEFICIENCY, FRACTURES): VITD: 41.51 ng/mL (ref 30.00–100.00)
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
## 2021-11-25 NOTE — Patient Instructions (Signed)
Please stop at the lab.  Please come back for a follow-up appointment in 1 year.  

## 2021-11-26 LAB — PTH, INTACT AND CALCIUM
Calcium: 9.6 mg/dL (ref 8.6–10.2)
PTH: 18 pg/mL (ref 15–65)

## 2022-01-05 DIAGNOSIS — L821 Other seborrheic keratosis: Secondary | ICD-10-CM | POA: Diagnosis not present

## 2022-01-05 DIAGNOSIS — D485 Neoplasm of uncertain behavior of skin: Secondary | ICD-10-CM | POA: Diagnosis not present

## 2022-01-05 DIAGNOSIS — L218 Other seborrheic dermatitis: Secondary | ICD-10-CM | POA: Diagnosis not present

## 2022-01-05 DIAGNOSIS — L812 Freckles: Secondary | ICD-10-CM | POA: Diagnosis not present

## 2022-01-05 DIAGNOSIS — D2339 Other benign neoplasm of skin of other parts of face: Secondary | ICD-10-CM | POA: Diagnosis not present

## 2022-01-16 ENCOUNTER — Other Ambulatory Visit: Payer: Self-pay | Admitting: Internal Medicine

## 2022-01-24 ENCOUNTER — Other Ambulatory Visit: Payer: Self-pay

## 2022-01-24 ENCOUNTER — Encounter (HOSPITAL_BASED_OUTPATIENT_CLINIC_OR_DEPARTMENT_OTHER): Payer: Self-pay

## 2022-01-24 ENCOUNTER — Emergency Department (HOSPITAL_BASED_OUTPATIENT_CLINIC_OR_DEPARTMENT_OTHER)
Admission: EM | Admit: 2022-01-24 | Discharge: 2022-01-24 | Disposition: A | Discharge status: Home / Self Care | Payer: Medicare Other | Attending: Emergency Medicine | Admitting: Emergency Medicine

## 2022-01-24 ENCOUNTER — Emergency Department (HOSPITAL_BASED_OUTPATIENT_CLINIC_OR_DEPARTMENT_OTHER): Payer: Medicare Other

## 2022-01-24 DIAGNOSIS — W294XXA Contact with nail gun, initial encounter: Secondary | ICD-10-CM | POA: Insufficient documentation

## 2022-01-24 DIAGNOSIS — S60551A Superficial foreign body of right hand, initial encounter: Secondary | ICD-10-CM | POA: Insufficient documentation

## 2022-01-24 DIAGNOSIS — Y990 Civilian activity done for income or pay: Secondary | ICD-10-CM | POA: Diagnosis not present

## 2022-01-24 DIAGNOSIS — S6991XA Unspecified injury of right wrist, hand and finger(s), initial encounter: Secondary | ICD-10-CM | POA: Diagnosis present

## 2022-01-24 DIAGNOSIS — M795 Residual foreign body in soft tissue: Secondary | ICD-10-CM | POA: Diagnosis not present

## 2022-01-24 MED ORDER — CEPHALEXIN 500 MG PO CAPS: 500.0000 mg | ORAL_CAPSULE | Freq: Three times a day (TID) | ORAL | 0 refills | Status: AC

## 2022-01-24 MED ORDER — LIDOCAINE HCL (PF) 1 % IJ SOLN
10.0000 mL | Freq: Once | INTRAMUSCULAR | Status: AC
  Administered 2022-01-24: 10 mL
  Filled 2022-01-24: qty 10

## 2022-01-24 MED ORDER — OXYCODONE-ACETAMINOPHEN 5-325 MG PO TABS
1.0000 | ORAL_TABLET | Freq: Once | ORAL | Status: AC
  Administered 2022-01-24: 1 via ORAL
  Filled 2022-01-24: qty 1

## 2022-01-24 NOTE — ED Notes (Signed)
Wound flushed and cleaned. Nail removed by provider.

## 2022-01-24 NOTE — ED Provider Notes (Addendum)
[facility] EMERGENCY DEPARTMENT Provider Note   CSN: [id number] Arrival date & time: 01/24/22  1945     History  Chief Complaint  Patient presents with   Hand Injury    Craig [name] is a 81 y.o. male.  Presented to the emergency room with concern for hand injury.  Reports that he accidentally shot himself in the right hand with a nail gun.  States that he is having severe pain from the nail.  No numbness, weakness, sensory changes, skin color changes.  Tried removing it but was unsuccessful at home.  HPI     Home Medications Prior to Admission medications   Medication Sig Start Date End Date Taking? Authorizing Provider  bisoprolol (ZEBETA) 5 MG tablet TAKE 1 TABLET BY MOUTH EVERY DAY 01/17/22   Philemon Kingdom, MD  Cholecalciferol (VITAMIN D3) 75 MCG (3000 UT) TABS Take 3,000 Units by mouth daily.    [provider]  diclofenac (VOLTAREN) 50 MG EC tablet Take 50 mg by mouth 2 (two) times daily as needed for moderate pain.    [provider]  furosemide (LASIX) 20 MG tablet TAKE 1/2 TABLET BY MOUTH DAILY 09/01/21   Philemon Kingdom, MD  multivitamin-lutein [facility]) CAPS capsule Take 1 capsule by mouth daily.    [provider]  simvastatin (ZOCOR) 20 MG tablet Take 20 mg by mouth daily.    [provider]  sodium chloride (OCEAN) 0.65 % SOLN nasal spray Place 1 spray into both nostrils as needed for congestion.    [provider]      Allergies    Patient has no known allergies.    Review of Systems   Review of Systems  Musculoskeletal:  Positive for arthralgias.  Skin:  Positive for wound.  All other systems reviewed and are negative.  Physical Exam Updated Vital Signs BP 119/71 (BP Location: Left Arm)    Pulse 70    Temp 98.1 F (36.7 C) (Oral)    Resp 18    Ht 6' (1.829 m)    Wt 90.7 kg    SpO2 97%    BMI 27.12 kg/m  Physical Exam Vitals and nursing note reviewed.  Constitutional:      General: He  is not in acute distress.    Appearance: He is well-developed.  HENT:     Head: Normocephalic and atraumatic.  Eyes:     Conjunctiva/sclera: Conjunctivae normal.  Cardiovascular:     Rate and Rhythm: Normal rate.     Pulses: Normal pulses.  Pulmonary:     Effort: Pulmonary effort is normal. No respiratory distress.  Musculoskeletal:     Cervical back: Neck supple.     Comments: Right hand: At the base of the hand there is only visible very pinpoint tip of metallic foreign body, distal sensation, motor, cap refill intact in all fingers  Skin:    General: Skin is warm and dry.     Capillary Refill: Capillary refill takes less than 2 seconds.  Neurological:     General: No focal deficit present.     Mental Status: He is alert.  Psychiatric:        Mood and Affect: Mood normal.        Behavior: Behavior normal.    ED Results / Procedures / Treatments   Labs (all labs ordered are listed, but only abnormal results are displayed) Labs Reviewed - No data to display  EKG None  Radiology DG Hand Complete Right  Result  Date: 01/24/2022 CLINICAL DATA:  Shot nail into hand EXAM: RIGHT HAND - COMPLETE 3+ VIEW COMPARISON:  None. FINDINGS: There is a nail noted within the anterior soft tissues. No definite bone involvement or fracture. No subluxation or dislocation. IMPRESSION: Nail within the anterior soft tissues of the right hand. No fracture. Electronically Signed   By: Rolm Baptise M.D.   On: 01/24/2022 20:16    Procedures .Foreign Body Removal  Date/Time: 01/24/2022 10:52 PM Performed by: Lucrezia Starch, MD Authorized by: Lucrezia Starch, MD  Consent: Verbal consent obtained. Risks and benefits: risks, benefits and alternatives were discussed Consent given by: patient Imaging studies: imaging studies available Body area: skin General location: upper extremity Location details: right hand Anesthesia: local infiltration  Anesthesia: Local Anesthetic: lidocaine 1%  without epinephrine Anesthetic total: 2 mL  Sedation: Patient sedated: no  Patient cooperative: yes Localization method: visualized Removal mechanism: forceps Dressing: dressing applied Tendon involvement: none 1 objects recovered. Objects recovered: one intact nail Post-procedure assessment: foreign body removed     Medications Ordered in ED Medications  lidocaine (PF) (XYLOCAINE) 1 % injection 10 mL (10 mLs Infiltration Given by Other 01/24/22 2215)  oxyCODONE-acetaminophen (PERCOCET/ROXICET) 5-325 MG per tablet 1 tablet (1 tablet Oral Given 01/24/22 2215)    ED Course/ Medical Decision Making/ A&P                           Medical Decision Making Amount and/or Complexity of Data Reviewed Radiology: ordered.  Risk Prescription drug management.   81 year old gentleman presenting to ER with concern for nail foreign body.  X-ray confirmed foreign body.  Independently reviewed x-ray imaging.  No fracture or dislocation.  He is neurovascular intact.  Performed foreign body removal, patient tolerated well.  Entirety of nail was removed.  Post procedure x-ray imaging negative for further foreign body.  Last Tdap 2020.  Will cover with course of Keflex for antibiotic prophylaxis.  Advised follow-up with hand surgery for recheck.  Return to ER as needed.  Wife updated throughout stay.    After the discussed management above, the patient was determined to be safe for discharge.  The patient was in agreement with this plan and all questions regarding their care were answered.  ED return precautions were discussed and the patient will return to the ED with any significant worsening of condition.         Final Clinical Impression(s) / ED Diagnoses Final diagnoses:  Superficial foreign body of right hand, initial encounter    Rx / DC Orders ED Discharge Orders     None         Lucrezia Starch, MD 01/24/22 2252    Lucrezia Starch, MD 01/24/22 2253

## 2022-01-24 NOTE — ED Triage Notes (Signed)
Pt to ED POV, ambulatory to triage. Pt accidentally shot a 1.5in nail in right hand with nail gun while working on furniture.  Nail is currently in right hand. No bleeding noted, swelling present. Pt A&Ox4, NAD noted.

## 2022-01-24 NOTE — Discharge Instructions (Signed)
Keep wound clean and dry.  Take antibiotic as prescribed to help prevent infection.  Follow-up with Ortho/hand specialist if desired/as needed.  If you develop redness, swelling, pus drainage or other new concerning symptom, come back to ER for reassessment.

## 2022-01-31 DIAGNOSIS — J208 Acute bronchitis due to other specified organisms: Secondary | ICD-10-CM | POA: Diagnosis not present

## 2022-02-03 DIAGNOSIS — J209 Acute bronchitis, unspecified: Secondary | ICD-10-CM | POA: Diagnosis not present

## 2022-02-03 DIAGNOSIS — E7800 Pure hypercholesterolemia, unspecified: Secondary | ICD-10-CM | POA: Diagnosis not present

## 2022-02-03 DIAGNOSIS — I10 Essential (primary) hypertension: Secondary | ICD-10-CM | POA: Diagnosis not present

## 2022-04-24 ENCOUNTER — Other Ambulatory Visit: Payer: Self-pay | Admitting: Internal Medicine

## 2022-07-05 DIAGNOSIS — H524 Presbyopia: Secondary | ICD-10-CM | POA: Diagnosis not present

## 2022-07-05 DIAGNOSIS — H04123 Dry eye syndrome of bilateral lacrimal glands: Secondary | ICD-10-CM | POA: Diagnosis not present

## 2022-07-05 DIAGNOSIS — H4321 Crystalline deposits in vitreous body, right eye: Secondary | ICD-10-CM | POA: Diagnosis not present

## 2022-07-05 DIAGNOSIS — H26491 Other secondary cataract, right eye: Secondary | ICD-10-CM | POA: Diagnosis not present

## 2022-07-17 IMAGING — DX DG HAND COMPLETE 3+V*R*
3 series · 3 of 3 positions shown · non-contrast
Comparison: Earlier today

CLINICAL DATA: Nail removal

EXAM:
RIGHT HAND - COMPLETE 3+ VIEW

[hand ap]
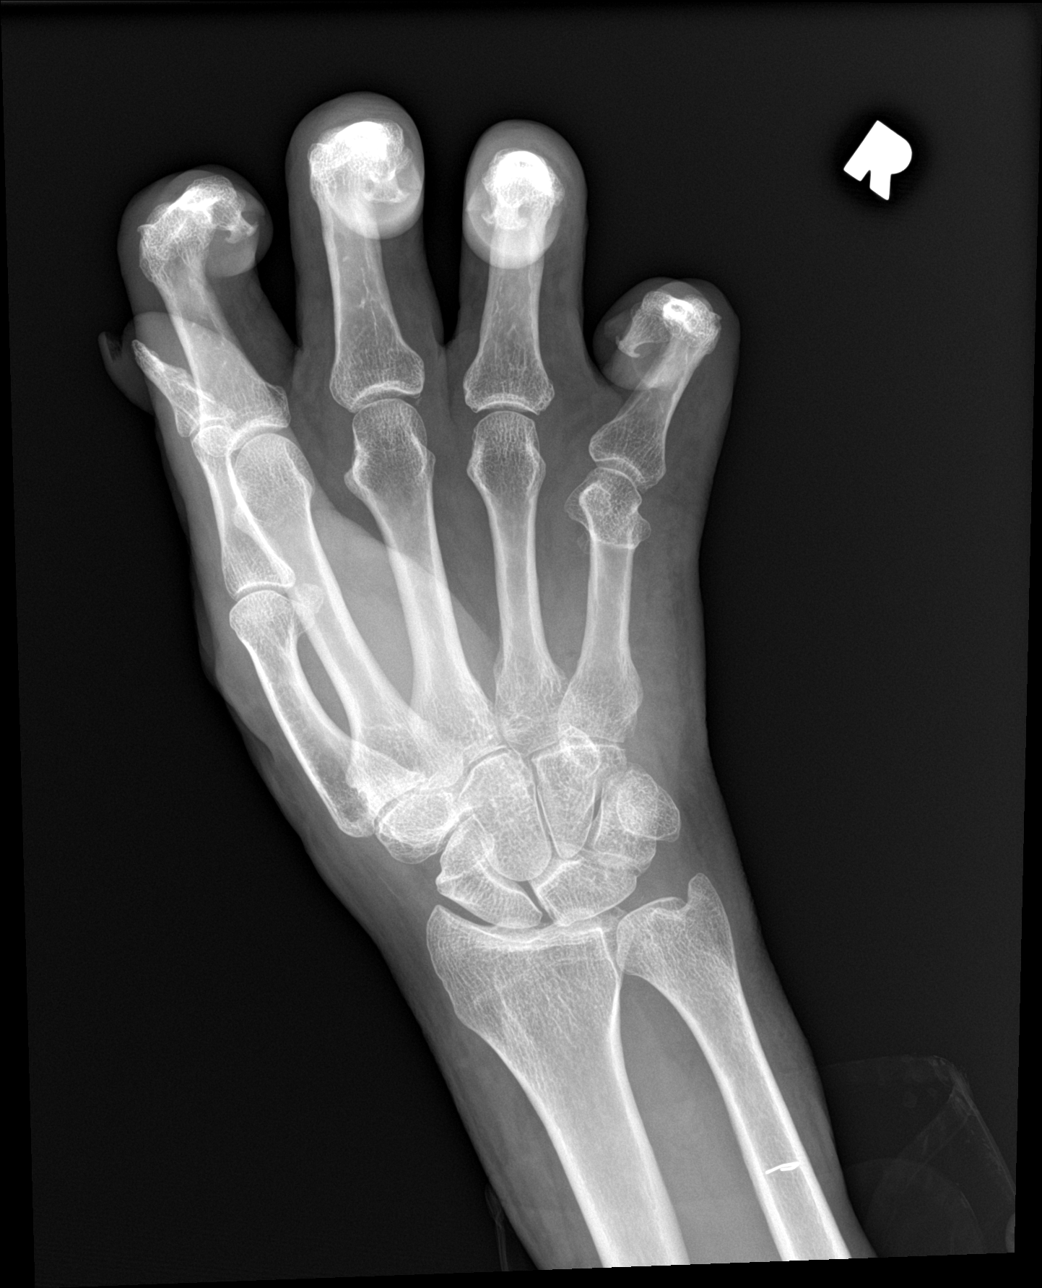

[hand obl]
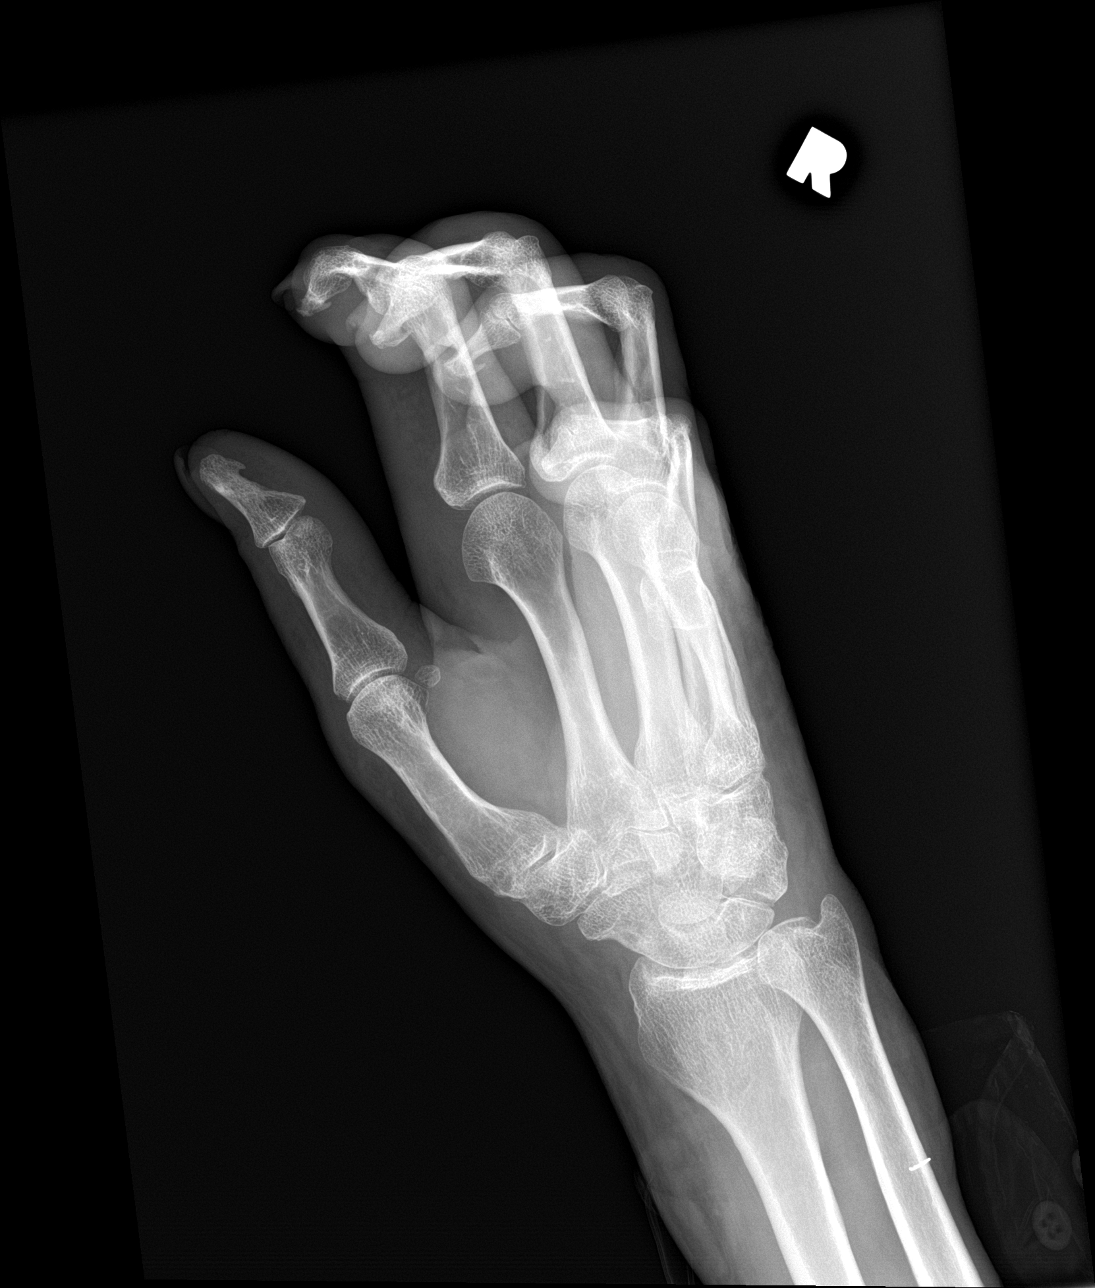

[hand lat]
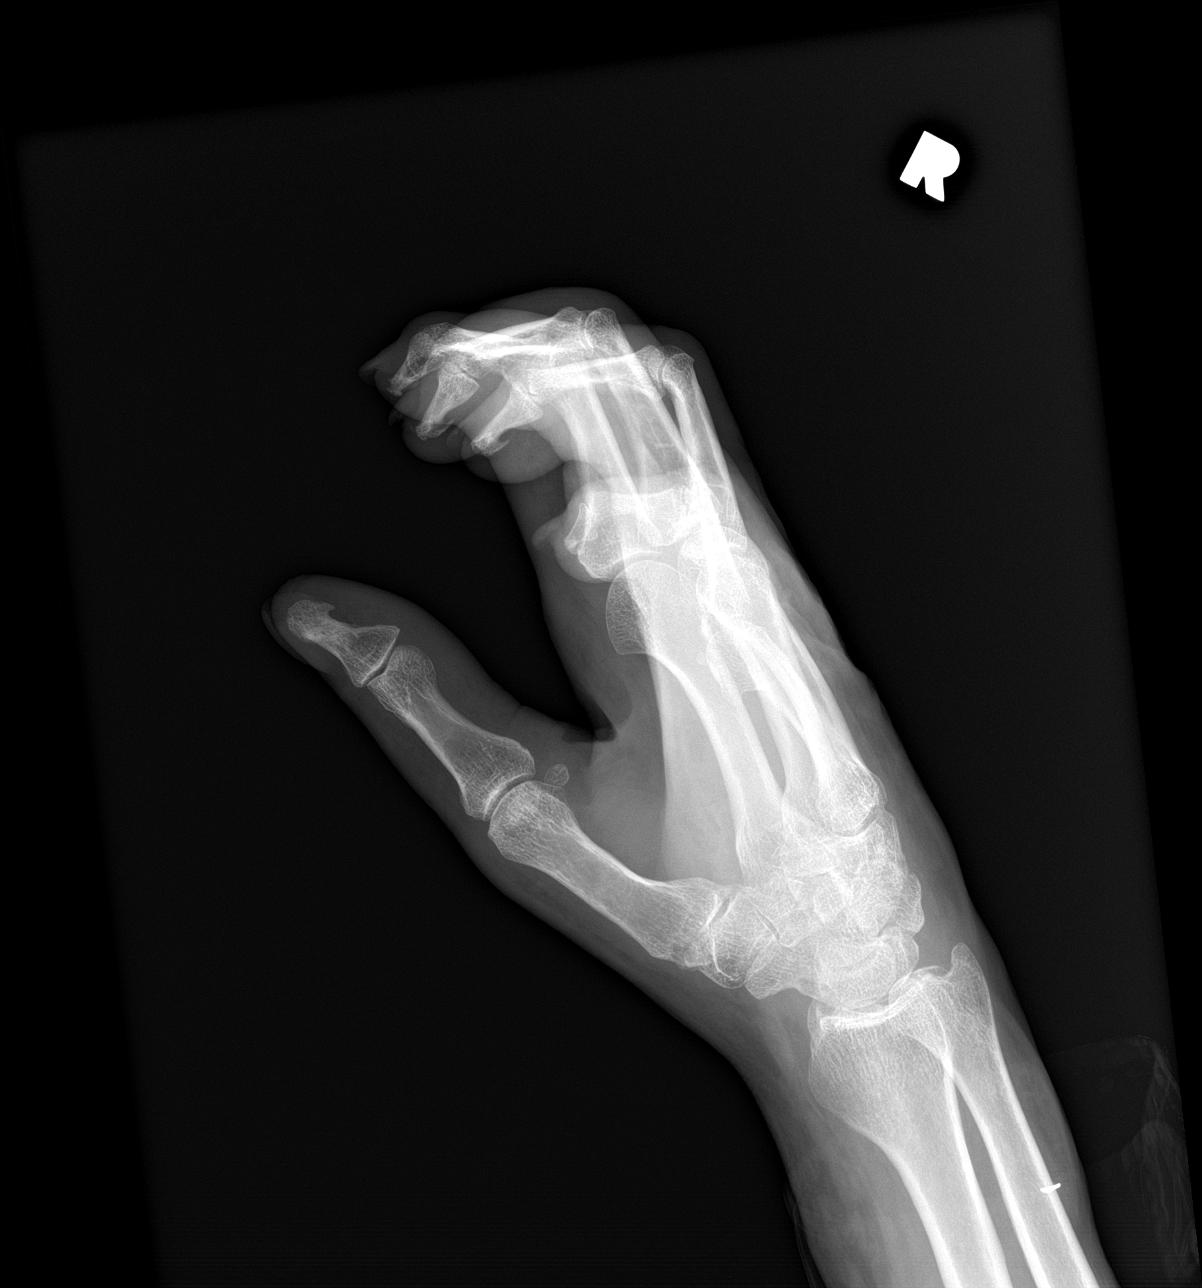

[3 of 3 positions shown; findings below may reference images not displayed]

FINDINGS: Interval removal of previously seen nail from the right hand. No
fracture. There is an additional radiopaque foreign body projecting
over the distal shaft of the right ulna, possible staple. This was
not imaged on earlier film.
IMPRESSION: Interval removal of nail within the right hand.  No fracture.

Separate radiopaque foreign body projecting over and possibly within
the distal ulnar shaft.
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
## 2022-08-03 DIAGNOSIS — M1990 Unspecified osteoarthritis, unspecified site: Secondary | ICD-10-CM | POA: Diagnosis not present

## 2022-08-03 DIAGNOSIS — H6121 Impacted cerumen, right ear: Secondary | ICD-10-CM | POA: Diagnosis not present

## 2022-08-03 DIAGNOSIS — I10 Essential (primary) hypertension: Secondary | ICD-10-CM | POA: Diagnosis not present

## 2022-08-03 DIAGNOSIS — E7800 Pure hypercholesterolemia, unspecified: Secondary | ICD-10-CM | POA: Diagnosis not present

## 2022-08-03 DIAGNOSIS — N529 Male erectile dysfunction, unspecified: Secondary | ICD-10-CM | POA: Diagnosis not present

## 2022-08-03 DIAGNOSIS — Z23 Encounter for immunization: Secondary | ICD-10-CM | POA: Diagnosis not present

## 2022-08-03 DIAGNOSIS — Z125 Encounter for screening for malignant neoplasm of prostate: Secondary | ICD-10-CM | POA: Diagnosis not present

## 2022-08-03 DIAGNOSIS — E041 Nontoxic single thyroid nodule: Secondary | ICD-10-CM | POA: Diagnosis not present

## 2022-08-03 DIAGNOSIS — M21371 Foot drop, right foot: Secondary | ICD-10-CM | POA: Diagnosis not present

## 2022-08-03 DIAGNOSIS — E213 Hyperparathyroidism, unspecified: Secondary | ICD-10-CM | POA: Diagnosis not present

## 2022-08-03 DIAGNOSIS — Z0000 Encounter for general adult medical examination without abnormal findings: Secondary | ICD-10-CM | POA: Diagnosis not present

## 2022-08-18 DIAGNOSIS — S51001D Unspecified open wound of right elbow, subsequent encounter: Secondary | ICD-10-CM | POA: Diagnosis not present

## 2022-08-26 ENCOUNTER — Other Ambulatory Visit: Payer: Self-pay | Admitting: Internal Medicine

## 2022-10-17 DIAGNOSIS — J069 Acute upper respiratory infection, unspecified: Secondary | ICD-10-CM | POA: Diagnosis not present

## 2022-10-24 ENCOUNTER — Ambulatory Visit
Admission: RE | Admit: 2022-10-24 | Discharge: 2022-10-24 | Disposition: A | Discharge status: Home / Self Care | Payer: Medicare Other | Source: Ambulatory Visit | Attending: Family Medicine | Admitting: Family Medicine

## 2022-10-24 ENCOUNTER — Other Ambulatory Visit: Payer: Self-pay | Admitting: Family Medicine

## 2022-10-24 DIAGNOSIS — J069 Acute upper respiratory infection, unspecified: Secondary | ICD-10-CM | POA: Diagnosis not present

## 2022-11-18 DIAGNOSIS — R052 Subacute cough: Secondary | ICD-10-CM | POA: Diagnosis not present

## 2022-11-24 ENCOUNTER — Other Ambulatory Visit: Payer: Self-pay | Admitting: Internal Medicine

## 2022-11-25 ENCOUNTER — Encounter: Payer: Self-pay | Admitting: Internal Medicine

## 2022-11-25 ENCOUNTER — Ambulatory Visit (INDEPENDENT_AMBULATORY_CARE_PROVIDER_SITE_OTHER): Payer: Medicare Other | Admitting: Internal Medicine

## 2022-11-25 VITALS — BP 120/76 | HR 68 | Ht 72.0 in | Wt 211.6 lb

## 2022-11-25 DIAGNOSIS — E559 Vitamin D deficiency, unspecified: Secondary | ICD-10-CM

## 2022-11-25 DIAGNOSIS — E041 Nontoxic single thyroid nodule: Secondary | ICD-10-CM | POA: Diagnosis not present

## 2022-11-25 DIAGNOSIS — E210 Primary hyperparathyroidism: Secondary | ICD-10-CM | POA: Diagnosis not present

## 2022-11-25 LAB — TSH: TSH: 3.23 u[IU]/mL (ref 0.35–5.50)

## 2022-11-25 LAB — VITAMIN D 25 HYDROXY (VIT D DEFICIENCY, FRACTURES): VITD: 44.92 ng/mL (ref 30.00–100.00)
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
## 2022-11-25 NOTE — Patient Instructions (Signed)
Please stop at the lab.  Please come back for a follow-up appointment in 1 year.  

## 2022-11-26 LAB — PTH, INTACT AND CALCIUM
Calcium: 9 mg/dL (ref 8.6–10.2)
PTH: 21 pg/mL (ref 15–65)

## 2022-11-29 ENCOUNTER — Other Ambulatory Visit (HOSPITAL_BASED_OUTPATIENT_CLINIC_OR_DEPARTMENT_OTHER): Payer: Self-pay | Admitting: Family Medicine

## 2022-11-29 ENCOUNTER — Other Ambulatory Visit: Payer: Self-pay | Admitting: Family Medicine

## 2022-11-29 DIAGNOSIS — R053 Chronic cough: Secondary | ICD-10-CM

## 2022-11-29 DIAGNOSIS — Z1389 Encounter for screening for other disorder: Secondary | ICD-10-CM | POA: Diagnosis not present

## 2022-12-02 ENCOUNTER — Ambulatory Visit
Admission: RE | Admit: 2022-12-02 | Discharge: 2022-12-02 | Disposition: A | Discharge status: Home / Self Care | Payer: Medicare Other | Source: Ambulatory Visit | Attending: Internal Medicine | Admitting: Internal Medicine

## 2022-12-02 DIAGNOSIS — E041 Nontoxic single thyroid nodule: Secondary | ICD-10-CM

## 2022-12-05 ENCOUNTER — Ambulatory Visit (HOSPITAL_BASED_OUTPATIENT_CLINIC_OR_DEPARTMENT_OTHER)
Admission: RE | Admit: 2022-12-05 | Discharge: 2022-12-05 | Disposition: A | Discharge status: Home / Self Care | Payer: Medicare Other | Source: Ambulatory Visit | Attending: Family Medicine | Admitting: Family Medicine

## 2022-12-05 DIAGNOSIS — J479 Bronchiectasis, uncomplicated: Secondary | ICD-10-CM | POA: Diagnosis not present

## 2022-12-05 DIAGNOSIS — J439 Emphysema, unspecified: Secondary | ICD-10-CM | POA: Diagnosis not present

## 2022-12-05 DIAGNOSIS — R053 Chronic cough: Secondary | ICD-10-CM | POA: Insufficient documentation

## 2022-12-05 LAB — POCT I-STAT CREATININE: Creatinine, Ser: 1.2 mg/dL (ref 0.61–1.24)

## 2022-12-05 MED ORDER — IOHEXOL 300 MG/ML  SOLN
80.0000 mL | Freq: Once | INTRAMUSCULAR | Status: AC | PRN
  Administered 2022-12-05: 80 mL via INTRAVENOUS

## 2022-12-09 ENCOUNTER — Ambulatory Visit: Payer: Medicare Other | Admitting: Internal Medicine

## 2022-12-22 ENCOUNTER — Encounter (HOSPITAL_BASED_OUTPATIENT_CLINIC_OR_DEPARTMENT_OTHER): Payer: Self-pay | Admitting: Pulmonary Disease

## 2022-12-22 ENCOUNTER — Ambulatory Visit (INDEPENDENT_AMBULATORY_CARE_PROVIDER_SITE_OTHER): Payer: Medicare Other | Admitting: Pulmonary Disease

## 2022-12-22 VITALS — BP 140/84 | HR 67 | Ht 72.0 in | Wt 211.0 lb

## 2022-12-22 DIAGNOSIS — J439 Emphysema, unspecified: Secondary | ICD-10-CM | POA: Diagnosis not present
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
## 2022-12-22 NOTE — Patient Instructions (Addendum)
Emphysema --START symbicort 80-4.5 mcg TWO puffs in morning and evening. Rinse your mouth out after use --ORDER pulmonary function tests  Calcified hilar and mediastinal adenopathy --Likely old granulomatous disease. No action required at this time as unlikely to be able to diagnose or treat with steroids --If symptoms persist, could consider PET scan in the future  Follow-up after next PFTs. Ok to schedule separately.

## 2022-12-23 ENCOUNTER — Telehealth: Payer: Self-pay | Admitting: Pulmonary Disease

## 2022-12-23 ENCOUNTER — Ambulatory Visit (INDEPENDENT_AMBULATORY_CARE_PROVIDER_SITE_OTHER): Payer: Medicare Other | Admitting: Pulmonary Disease

## 2022-12-23 DIAGNOSIS — J439 Emphysema, unspecified: Secondary | ICD-10-CM | POA: Diagnosis not present

## 2022-12-23 LAB — PULMONARY FUNCTION TEST
DL/VA % pred: 95 %
DL/VA: 3.71 ml/min/mmHg/L
DLCO cor % pred: 92 %
DLCO cor: 24.05 ml/min/mmHg
DLCO unc % pred: 92 %
DLCO unc: 24.05 ml/min/mmHg
FEF 25-75 Post: 3.19 L/sec
FEF 25-75 Pre: 2.78 L/sec
FEF2575-%Change-Post: 14 %
FEF2575-%Pred-Post: 143 %
FEF2575-%Pred-Pre: 125 %
FEV1-%Change-Post: 3 %
FEV1-%Pred-Post: 108 %
FEV1-%Pred-Pre: 104 %
FEV1-Post: 3.45 L
FEV1-Pre: 3.32 L
FEV1FVC-%Change-Post: 0 %
FEV1FVC-%Pred-Pre: 104 %
FEV6-%Change-Post: 4 %
FEV6-%Pred-Post: 110 %
FEV6-%Pred-Pre: 105 %
FEV6-Post: 4.56 L
FEV6-Pre: 4.37 L
FEV6FVC-%Change-Post: 0 %
FEV6FVC-%Pred-Post: 105 %
FEV6FVC-%Pred-Pre: 105 %
FVC-%Change-Post: 4 %
FVC-%Pred-Post: 104 %
FVC-%Pred-Pre: 100 %
FVC-Post: 4.61 L
FVC-Pre: 4.42 L
Post FEV1/FVC ratio: 75 %
Post FEV6/FVC ratio: 99 %
Pre FEV1/FVC ratio: 75 %
Pre FEV6/FVC Ratio: 99 %
RV % pred: 94 %
RV: 2.6 L
TLC % pred: 97 %
TLC: 7.27 L

## 2022-12-23 NOTE — Progress Notes (Signed)
Full PFT Performed Today  

## 2022-12-23 NOTE — Telephone Encounter (Signed)
Craig [name] Telephone Encounter  Updated patient on PFTs. Normal PFTs. Still recommend continuing inhalers for his known emphysema on CT and symptoms.   Please push patient's clinic visit out two months for follow-up

## 2022-12-23 NOTE — Patient Instructions (Signed)
Full PFT Performed Today  

## 2023-01-04 ENCOUNTER — Ambulatory Visit (HOSPITAL_BASED_OUTPATIENT_CLINIC_OR_DEPARTMENT_OTHER): Payer: Medicare Other | Admitting: Pulmonary Disease

## 2023-02-07 DIAGNOSIS — I10 Essential (primary) hypertension: Secondary | ICD-10-CM | POA: Diagnosis not present

## 2023-02-07 DIAGNOSIS — N401 Enlarged prostate with lower urinary tract symptoms: Secondary | ICD-10-CM | POA: Diagnosis not present

## 2023-02-07 DIAGNOSIS — E041 Nontoxic single thyroid nodule: Secondary | ICD-10-CM | POA: Diagnosis not present

## 2023-02-07 DIAGNOSIS — M21371 Foot drop, right foot: Secondary | ICD-10-CM | POA: Diagnosis not present

## 2023-02-07 DIAGNOSIS — E7800 Pure hypercholesterolemia, unspecified: Secondary | ICD-10-CM | POA: Diagnosis not present

## 2023-02-07 DIAGNOSIS — N138 Other obstructive and reflux uropathy: Secondary | ICD-10-CM | POA: Diagnosis not present

## 2023-02-21 ENCOUNTER — Encounter (HOSPITAL_BASED_OUTPATIENT_CLINIC_OR_DEPARTMENT_OTHER): Payer: Self-pay | Admitting: Pulmonary Disease

## 2023-02-21 ENCOUNTER — Ambulatory Visit (INDEPENDENT_AMBULATORY_CARE_PROVIDER_SITE_OTHER): Payer: Medicare Other | Admitting: Pulmonary Disease

## 2023-02-21 VITALS — BP 118/72 | HR 78 | Ht 72.0 in | Wt 210.4 lb

## 2023-02-21 DIAGNOSIS — J439 Emphysema, unspecified: Secondary | ICD-10-CM | POA: Diagnosis not present

## 2023-02-21 MED ORDER — BUDESONIDE-FORMOTEROL FUMARATE 80-4.5 MCG/ACT IN AERO: 2.0000 | INHALATION_SPRAY | Freq: Two times a day (BID) | RESPIRATORY_TRACT | 0 refills | Status: DC

## 2023-02-21 NOTE — Patient Instructions (Signed)
Emphysema --CONTINUE Symbicort 80-4.5 mcg AS NEEDED with TWO puffs in morning and evening if you re-develop respiratory symptoms. Rinse your mouth out after use
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
## 2023-06-28 DIAGNOSIS — L821 Other seborrheic keratosis: Secondary | ICD-10-CM | POA: Diagnosis not present

## 2023-06-28 DIAGNOSIS — L820 Inflamed seborrheic keratosis: Secondary | ICD-10-CM | POA: Diagnosis not present

## 2023-06-28 DIAGNOSIS — L918 Other hypertrophic disorders of the skin: Secondary | ICD-10-CM | POA: Diagnosis not present

## 2023-06-28 DIAGNOSIS — L84 Corns and callosities: Secondary | ICD-10-CM | POA: Diagnosis not present

## 2023-06-28 DIAGNOSIS — L812 Freckles: Secondary | ICD-10-CM | POA: Diagnosis not present

## 2023-07-18 DIAGNOSIS — H26491 Other secondary cataract, right eye: Secondary | ICD-10-CM | POA: Diagnosis not present

## 2023-07-18 DIAGNOSIS — H04123 Dry eye syndrome of bilateral lacrimal glands: Secondary | ICD-10-CM | POA: Diagnosis not present

## 2023-07-18 DIAGNOSIS — H52202 Unspecified astigmatism, left eye: Secondary | ICD-10-CM | POA: Diagnosis not present

## 2023-07-18 DIAGNOSIS — H4321 Crystalline deposits in vitreous body, right eye: Secondary | ICD-10-CM | POA: Diagnosis not present

## 2023-07-18 DIAGNOSIS — H524 Presbyopia: Secondary | ICD-10-CM | POA: Diagnosis not present

## 2023-08-25 DIAGNOSIS — E041 Nontoxic single thyroid nodule: Secondary | ICD-10-CM | POA: Diagnosis not present

## 2023-08-25 DIAGNOSIS — M1990 Unspecified osteoarthritis, unspecified site: Secondary | ICD-10-CM | POA: Diagnosis not present

## 2023-08-25 DIAGNOSIS — E213 Hyperparathyroidism, unspecified: Secondary | ICD-10-CM | POA: Diagnosis not present

## 2023-08-25 DIAGNOSIS — I700 Atherosclerosis of aorta: Secondary | ICD-10-CM | POA: Diagnosis not present

## 2023-08-25 DIAGNOSIS — Z125 Encounter for screening for malignant neoplasm of prostate: Secondary | ICD-10-CM | POA: Diagnosis not present

## 2023-08-25 DIAGNOSIS — Z0000 Encounter for general adult medical examination without abnormal findings: Secondary | ICD-10-CM | POA: Diagnosis not present

## 2023-08-25 DIAGNOSIS — J432 Centrilobular emphysema: Secondary | ICD-10-CM | POA: Diagnosis not present

## 2023-08-25 DIAGNOSIS — M21371 Foot drop, right foot: Secondary | ICD-10-CM | POA: Diagnosis not present

## 2023-08-25 DIAGNOSIS — E7800 Pure hypercholesterolemia, unspecified: Secondary | ICD-10-CM | POA: Diagnosis not present

## 2023-08-25 DIAGNOSIS — I10 Essential (primary) hypertension: Secondary | ICD-10-CM | POA: Diagnosis not present

## 2023-08-25 DIAGNOSIS — R590 Localized enlarged lymph nodes: Secondary | ICD-10-CM | POA: Diagnosis not present

## 2023-08-25 DIAGNOSIS — N529 Male erectile dysfunction, unspecified: Secondary | ICD-10-CM | POA: Diagnosis not present

## 2023-09-15 DIAGNOSIS — Z23 Encounter for immunization: Secondary | ICD-10-CM | POA: Diagnosis not present

## 2023-10-03 DIAGNOSIS — J988 Other specified respiratory disorders: Secondary | ICD-10-CM | POA: Diagnosis not present

## 2023-10-10 DIAGNOSIS — R059 Cough, unspecified: Secondary | ICD-10-CM | POA: Diagnosis not present

## 2023-10-10 DIAGNOSIS — R051 Acute cough: Secondary | ICD-10-CM | POA: Diagnosis not present

## 2023-10-10 DIAGNOSIS — J189 Pneumonia, unspecified organism: Secondary | ICD-10-CM | POA: Diagnosis not present

## 2023-10-10 DIAGNOSIS — I700 Atherosclerosis of aorta: Secondary | ICD-10-CM | POA: Diagnosis not present

## 2023-10-24 ENCOUNTER — Other Ambulatory Visit: Payer: Self-pay | Admitting: Internal Medicine

## 2023-10-24 ENCOUNTER — Ambulatory Visit
Admission: RE | Admit: 2023-10-24 | Discharge: 2023-10-24 | Disposition: A | Discharge status: Home / Self Care | Payer: Medicare Other | Source: Ambulatory Visit | Attending: Internal Medicine

## 2023-10-24 DIAGNOSIS — J988 Other specified respiratory disorders: Secondary | ICD-10-CM

## 2023-10-24 DIAGNOSIS — R0602 Shortness of breath: Secondary | ICD-10-CM | POA: Diagnosis not present

## 2023-10-24 DIAGNOSIS — I700 Atherosclerosis of aorta: Secondary | ICD-10-CM | POA: Diagnosis not present

## 2023-10-24 DIAGNOSIS — R059 Cough, unspecified: Secondary | ICD-10-CM | POA: Diagnosis not present

## 2023-11-27 ENCOUNTER — Ambulatory Visit (INDEPENDENT_AMBULATORY_CARE_PROVIDER_SITE_OTHER): Payer: Medicare Other | Admitting: Internal Medicine

## 2023-11-27 ENCOUNTER — Encounter: Payer: Self-pay | Admitting: Internal Medicine

## 2023-11-27 VITALS — BP 124/80 | HR 59 | Ht 72.0 in | Wt 207.4 lb

## 2023-11-27 DIAGNOSIS — E041 Nontoxic single thyroid nodule: Secondary | ICD-10-CM | POA: Diagnosis not present

## 2023-11-27 DIAGNOSIS — E210 Primary hyperparathyroidism: Secondary | ICD-10-CM

## 2023-11-27 DIAGNOSIS — E559 Vitamin D deficiency, unspecified: Secondary | ICD-10-CM
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
## 2023-11-27 NOTE — Patient Instructions (Signed)
Please stop at the lab.  Please come back for a follow-up appointment in 2 years.

## 2023-11-30 LAB — CALCIUM, IONIZED: Calcium, Ion: 5.3 mg/dL (ref 4.7–5.5)

## 2023-11-30 LAB — VITAMIN D 25 HYDROXY (VIT D DEFICIENCY, FRACTURES): Vit D, 25-Hydroxy: 66 ng/mL (ref 30–100)

## 2023-12-16 DIAGNOSIS — S80211A Abrasion, right knee, initial encounter: Secondary | ICD-10-CM | POA: Diagnosis not present

## 2023-12-16 DIAGNOSIS — W19XXXA Unspecified fall, initial encounter: Secondary | ICD-10-CM | POA: Diagnosis not present

## 2023-12-16 DIAGNOSIS — R0781 Pleurodynia: Secondary | ICD-10-CM | POA: Diagnosis not present

## 2023-12-17 ENCOUNTER — Other Ambulatory Visit: Payer: Self-pay | Admitting: Internal Medicine

## 2024-02-18 ENCOUNTER — Other Ambulatory Visit: Payer: Self-pay | Admitting: Internal Medicine

## 2024-03-18 ENCOUNTER — Encounter (HOSPITAL_BASED_OUTPATIENT_CLINIC_OR_DEPARTMENT_OTHER): Payer: Self-pay | Admitting: Pulmonary Disease

## 2024-03-18 ENCOUNTER — Ambulatory Visit (HOSPITAL_BASED_OUTPATIENT_CLINIC_OR_DEPARTMENT_OTHER): Payer: PRIVATE HEALTH INSURANCE | Admitting: Pulmonary Disease

## 2024-03-18 VITALS — BP 124/82 | HR 67 | Ht 72.0 in | Wt 212.5 lb

## 2024-03-18 DIAGNOSIS — J439 Emphysema, unspecified: Secondary | ICD-10-CM
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
## 2024-03-20 NOTE — Patient Instructions (Signed)
Emphysema --CONTINUE Symbicort 80-4.5 mcg AS NEEDED with TWO puffs in morning and evening if you re-develop respiratory symptoms. Rinse your mouth out after use

## 2024-04-24 DIAGNOSIS — R768 Other specified abnormal immunological findings in serum: Secondary | ICD-10-CM | POA: Diagnosis not present

## 2024-04-24 DIAGNOSIS — I10 Essential (primary) hypertension: Secondary | ICD-10-CM | POA: Diagnosis not present

## 2024-04-24 DIAGNOSIS — Z9289 Personal history of other medical treatment: Secondary | ICD-10-CM | POA: Diagnosis not present

## 2024-04-24 DIAGNOSIS — B1920 Unspecified viral hepatitis C without hepatic coma: Secondary | ICD-10-CM | POA: Diagnosis not present

## 2024-05-03 ENCOUNTER — Other Ambulatory Visit (HOSPITAL_COMMUNITY): Payer: Self-pay

## 2024-05-03 ENCOUNTER — Telehealth: Payer: Self-pay

## 2024-05-03 NOTE — Telephone Encounter (Signed)
 [facility] Patient Advocate Encounter  Insurance verification completed.    The patient is insured through [employer].     Ran test claim for EPCLUSA  Medication will need a PA.  Ran test claim for MAVYRET  Medication will need a PA. PRODUCT NOT ON FORMULARY    We will continue to follow to see if copay assistance is needed.  This test claim was processed through [facility]- copay amounts may vary at other pharmacies due to pharmacy/plan contracts, or as the patient moves through the different stages of their insurance plan.

## 2024-05-06 ENCOUNTER — Other Ambulatory Visit: Payer: Self-pay

## 2024-05-06 ENCOUNTER — Ambulatory Visit (INDEPENDENT_AMBULATORY_CARE_PROVIDER_SITE_OTHER): Payer: PRIVATE HEALTH INSURANCE | Admitting: Infectious Diseases

## 2024-05-06 VITALS — BP 133/82 | HR 60 | Temp 98.1°F | Ht 72.0 in | Wt 209.0 lb

## 2024-05-06 DIAGNOSIS — Z114 Encounter for screening for human immunodeficiency virus [HIV]: Secondary | ICD-10-CM | POA: Insufficient documentation

## 2024-05-06 DIAGNOSIS — B182 Chronic viral hepatitis C: Secondary | ICD-10-CM

## 2024-05-06 DIAGNOSIS — Z23 Encounter for immunization: Secondary | ICD-10-CM

## 2024-05-06 DIAGNOSIS — Z7289 Other problems related to lifestyle: Secondary | ICD-10-CM | POA: Diagnosis not present
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
## 2024-05-11 LAB — LIVER FIBROSIS, FIBROTEST-ACTITEST
ALT: 29 U/L (ref 9–46)
Alpha-2-Macroglobulin: 315 mg/dL — ABNORMAL HIGH (ref 106–279)
Apolipoprotein A1: 145 mg/dL (ref 94–176)
Bilirubin: 0.7 mg/dL (ref 0.2–1.2)
Fibrosis Score: 0.59
GGT: 15 U/L (ref 3–70)
Haptoglobin: 148 mg/dL (ref 43–212)
Necroinflammat ACT Score: 0.2
Reference ID: 5538663

## 2024-05-11 LAB — COMPREHENSIVE METABOLIC PANEL WITH GFR
AG Ratio: 1.5 (calc) (ref 1.0–2.5)
ALT: 31 U/L (ref 9–46)
AST: 28 U/L (ref 10–35)
Albumin: 4.1 g/dL (ref 3.6–5.1)
Alkaline phosphatase (APISO): 77 U/L (ref 35–144)
BUN: 17 mg/dL (ref 7–25)
CO2: 23 mmol/L (ref 20–32)
Calcium: 9.4 mg/dL (ref 8.6–10.3)
Chloride: 106 mmol/L (ref 98–110)
Creat: 0.9 mg/dL (ref 0.70–1.22)
Globulin: 2.7 g/dL (ref 1.9–3.7)
Glucose, Bld: 129 mg/dL — ABNORMAL HIGH (ref 65–99)
Potassium: 4.3 mmol/L (ref 3.5–5.3)
Sodium: 139 mmol/L (ref 135–146)
Total Bilirubin: 0.7 mg/dL (ref 0.2–1.2)
Total Protein: 6.8 g/dL (ref 6.1–8.1)
eGFR: 86 mL/min/{1.73_m2} (ref >=60)

## 2024-05-11 LAB — HIV ANTIBODY (ROUTINE TESTING W REFLEX): HIV 1&2 Ab, 4th Generation: NONREACTIVE

## 2024-05-11 LAB — CBC
HCT: 43.8 % (ref 38.5–50.0)
Hemoglobin: 14.3 g/dL (ref 13.2–17.1)
MCH: 31.4 pg (ref 27.0–33.0)
MCHC: 32.6 g/dL (ref 32.0–36.0)
MCV: 96.1 fL (ref 80.0–100.0)
MPV: 12.8 fL — ABNORMAL HIGH (ref 7.5–12.5)
Platelets: 156 10*3/uL (ref 140–400)
RBC: 4.56 10*6/uL (ref 4.20–5.80)
RDW: 12.6 % (ref 11.0–15.0)
WBC: 5.8 10*3/uL (ref 3.8–10.8)

## 2024-05-11 LAB — HEPATITIS C GENOTYPE

## 2024-05-11 LAB — HEPATITIS B SURFACE ANTIGEN: Hepatitis B Surface Ag: NONREACTIVE

## 2024-05-11 LAB — HEPATITIS A ANTIBODY, TOTAL: Hepatitis A AB,Total: NONREACTIVE

## 2024-05-11 LAB — PROTIME-INR
INR: 1.4 — ABNORMAL HIGH
Prothrombin Time: 15.1 s — ABNORMAL HIGH (ref 9.0–11.5)

## 2024-05-11 LAB — HEPATITIS B CORE ANTIBODY, TOTAL: Hep B Core Total Ab: REACTIVE — AB

## 2024-05-11 LAB — HEPATITIS C RNA QUANTITATIVE
HCV Quantitative Log: 6.61 {Log_IU}/mL — ABNORMAL HIGH
HCV RNA, PCR, QN: 4030000 [IU]/mL — ABNORMAL HIGH

## 2024-05-11 LAB — HEPATITIS B SURFACE ANTIBODY,QUALITATIVE: Hep B S Ab: NONREACTIVE

## 2024-05-15 ENCOUNTER — Ambulatory Visit (HOSPITAL_COMMUNITY)
Admission: RE | Admit: 2024-05-15 | Discharge: 2024-05-15 | Disposition: A | Discharge status: Home / Self Care | Source: Ambulatory Visit | Attending: Infectious Diseases | Admitting: Infectious Diseases

## 2024-05-15 DIAGNOSIS — Z9049 Acquired absence of other specified parts of digestive tract: Secondary | ICD-10-CM | POA: Diagnosis not present

## 2024-05-15 DIAGNOSIS — Z944 Liver transplant status: Secondary | ICD-10-CM | POA: Diagnosis not present

## 2024-05-15 DIAGNOSIS — B182 Chronic viral hepatitis C: Secondary | ICD-10-CM | POA: Insufficient documentation

## 2024-05-15 DIAGNOSIS — R932 Abnormal findings on diagnostic imaging of liver and biliary tract: Secondary | ICD-10-CM | POA: Diagnosis not present

## 2024-05-28 ENCOUNTER — Other Ambulatory Visit: Payer: Self-pay

## 2024-05-28 ENCOUNTER — Other Ambulatory Visit (HOSPITAL_COMMUNITY): Payer: Self-pay

## 2024-05-28 ENCOUNTER — Telehealth: Payer: Self-pay

## 2024-05-28 ENCOUNTER — Encounter: Payer: Self-pay | Admitting: Infectious Diseases

## 2024-05-28 ENCOUNTER — Ambulatory Visit: Admitting: Infectious Diseases

## 2024-05-28 VITALS — BP 127/78 | HR 71 | Temp 97.4°F | Ht 72.0 in | Wt 207.0 lb

## 2024-05-28 DIAGNOSIS — I10 Essential (primary) hypertension: Secondary | ICD-10-CM | POA: Diagnosis not present

## 2024-05-28 DIAGNOSIS — B182 Chronic viral hepatitis C: Secondary | ICD-10-CM | POA: Diagnosis not present

## 2024-05-28 DIAGNOSIS — Z79899 Other long term (current) drug therapy: Secondary | ICD-10-CM | POA: Diagnosis not present

## 2024-05-28 DIAGNOSIS — Z23 Encounter for immunization: Secondary | ICD-10-CM

## 2024-05-28 DIAGNOSIS — N401 Enlarged prostate with lower urinary tract symptoms: Secondary | ICD-10-CM | POA: Diagnosis not present

## 2024-05-28 DIAGNOSIS — E7800 Pure hypercholesterolemia, unspecified: Secondary | ICD-10-CM | POA: Diagnosis not present

## 2024-05-28 DIAGNOSIS — L905 Scar conditions and fibrosis of skin: Secondary | ICD-10-CM | POA: Diagnosis not present

## 2024-05-28 DIAGNOSIS — M1990 Unspecified osteoarthritis, unspecified site: Secondary | ICD-10-CM | POA: Diagnosis not present

## 2024-05-28 DIAGNOSIS — L309 Dermatitis, unspecified: Secondary | ICD-10-CM | POA: Diagnosis not present

## 2024-05-28 DIAGNOSIS — R7301 Impaired fasting glucose: Secondary | ICD-10-CM | POA: Diagnosis not present

## 2024-05-28 DIAGNOSIS — Z712 Person consulting for explanation of examination or test findings: Secondary | ICD-10-CM | POA: Insufficient documentation
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
## 2024-05-28 NOTE — Telephone Encounter (Signed)
 Pharmacy Patient Advocate Encounter   Received notification from CoverMyMeds that prior authorization for EPCLUSA is required/requested.   Insurance verification completed.   The patient is insured through [facility] [place] .   Per test claim: PA required; PA submitted to above mentioned insurance via CoverMyMeds Key/confirmation #/EOC [id number] Status is pending   CLINICAL QUESTIONS ANSWERED

## 2024-05-28 NOTE — Telephone Encounter (Addendum)
 Pharmacy Patient Advocate Encounter  Received notification from [facility] Medicaid that Prior Authorization for [facility] has been APPROVED from 05/28/24 to 08/20/24. Ran test claim, Copay is [id number]. This test claim was processed through [facility]- copay amounts may vary at other pharmacies due to pharmacy/plan contracts, or as the patient moves through the different stages of their insurance plan.   PA #/Case ID/Reference #: [id number]  GETTING PATIENT GOOD DAYS ASSISTANCE TO HELP WITH CO-PAY

## 2024-05-29 ENCOUNTER — Ambulatory Visit: Payer: Self-pay | Admitting: Infectious Diseases

## 2024-05-29 DIAGNOSIS — Z23 Encounter for immunization: Secondary | ICD-10-CM | POA: Insufficient documentation

## 2024-06-04 ENCOUNTER — Telehealth: Payer: Self-pay

## 2024-06-04 NOTE — Telephone Encounter (Signed)
 We have tried to get the patient co-pay assistance through [employer], and Good days and the funds are closed at this moment. We will continue to watch out for the funds to see if they will open. Encounter will be documented when this change occurs. Unfortunately patient needs to contact insurance to get further assistance .

## 2024-06-12 DIAGNOSIS — S41101A Unspecified open wound of right upper arm, initial encounter: Secondary | ICD-10-CM | POA: Diagnosis not present

## 2024-06-12 DIAGNOSIS — Z87828 Personal history of other (healed) physical injury and trauma: Secondary | ICD-10-CM | POA: Diagnosis not present

## 2024-06-17 DIAGNOSIS — S51001D Unspecified open wound of right elbow, subsequent encounter: Secondary | ICD-10-CM | POA: Diagnosis not present

## 2024-06-19 ENCOUNTER — Other Ambulatory Visit (HOSPITAL_COMMUNITY): Payer: Self-pay

## 2024-07-02 ENCOUNTER — Other Ambulatory Visit (HOSPITAL_COMMUNITY): Payer: Self-pay

## 2024-07-02 DIAGNOSIS — L821 Other seborrheic keratosis: Secondary | ICD-10-CM | POA: Diagnosis not present

## 2024-07-02 DIAGNOSIS — L812 Freckles: Secondary | ICD-10-CM | POA: Diagnosis not present

## 2024-07-02 DIAGNOSIS — L300 Nummular dermatitis: Secondary | ICD-10-CM | POA: Diagnosis not present

## 2024-07-02 DIAGNOSIS — D3612 Benign neoplasm of peripheral nerves and autonomic nervous system, upper limb, including shoulder: Secondary | ICD-10-CM | POA: Diagnosis not present

## 2024-07-03 ENCOUNTER — Telehealth: Payer: Self-pay

## 2024-07-03 ENCOUNTER — Other Ambulatory Visit (HOSPITAL_COMMUNITY): Payer: Self-pay

## 2024-07-03 NOTE — Telephone Encounter (Signed)
 Spoke to the patients wife Masaji Billups) today (07/03/24) discussed the issues with Epclusa . I let the patient know that she will need to contact his medicare insurance to enroll the patient in the Medicare Prescription Payment Plan Program. Discussed that this should help with co-pays but she will need to get further information on the plan to she if she would like to proceed. At this time there are still no funds currently available. The only other options is to pay the $1946.48 co-pay. Will update once we have heard back from the patient or their wife.

## 2024-07-04 ENCOUNTER — Other Ambulatory Visit: Payer: Self-pay

## 2024-07-04 ENCOUNTER — Other Ambulatory Visit (HOSPITAL_COMMUNITY): Payer: Self-pay

## 2024-07-05 ENCOUNTER — Other Ambulatory Visit (HOSPITAL_COMMUNITY): Payer: Self-pay

## 2024-07-05 ENCOUNTER — Other Ambulatory Visit: Payer: Self-pay

## 2024-07-05 ENCOUNTER — Other Ambulatory Visit: Payer: Self-pay | Admitting: Pharmacist

## 2024-07-05 DIAGNOSIS — B182 Chronic viral hepatitis C: Secondary | ICD-10-CM

## 2024-07-05 MED ORDER — SOFOSBUVIR-VELPATASVIR 400-100 MG PO TABS
1.0000 | ORAL_TABLET | Freq: Every day | ORAL | 2 refills | Status: DC
  Filled 2024-07-05: qty 28, 28d supply, fill #0
  Filled 2024-08-23: qty 28, 28d supply, fill #1
  Filled 2024-09-18: qty 28, 28d supply, fill #2

## 2024-07-05 NOTE — Progress Notes (Addendum)
 Specialty Pharmacy Initial Fill Coordination Note  Craig [name] is a 83 y.o. male contacted today regarding initial fill of specialty medication(s) Sofosbuvir -Velpatasvir    Patient requested Delivery   Delivery date: 07/08/24   Verified address: [facility] [address] [place] [address]   Medication will be filled on 07/05/24.   Patient is aware of [phone]. copayment.     Patient will pay.

## 2024-07-05 NOTE — Progress Notes (Signed)
 Given unavailability of HCV funds and copay assistance, [facility] has been delaying patient's Epclusa  start. Charmaine spoke with family yesterday, and they would like to proceed with Epclusa  even with monthly copay ~$2,000. Sending rx to [facility].  Alan Geralds, PharmD, CPP, BCIDP, AAHIVP Clinical Pharmacist Practitioner Infectious Diseases Clinical Pharmacist [facility]

## 2024-07-08 ENCOUNTER — Telehealth: Payer: Self-pay

## 2024-07-08 NOTE — Telephone Encounter (Signed)
 [facility] Patient Advocate Encounter  Patient's medications Epclusa  have been couriered to [facility] from [facility] and will be  picked up on 07/09/24.  Arland Hutchinson, CPhT Specialty Pharmacy Patient [facility] for Infect[facility] Disease Phone: [phone] Fax:  [phone]

## 2024-07-25 ENCOUNTER — Other Ambulatory Visit: Payer: Self-pay | Admitting: Pharmacist

## 2024-07-25 NOTE — Progress Notes (Unsigned)
 Called patient to let him know that his medication is available to be picked up at the clinic. Counseled the patient on how to take the medication and potential side effects. Educated that if he has any new muscle pain or intolerance to his statin while on Epclusa  he can half the dose. He will stop by tomorrow to pick up the medication.

## 2024-07-25 NOTE — Progress Notes (Signed)
 Specialty Pharmacy Initiation Note   Craig [name] is a 83 y.o. male who will be followed by the specialty pharmacy service for RxSp Hepatitis C    Review of administration, indication, effectiveness, safety, potential side effects, storage/disposable, and missed dose instructions occurred today for patient's specialty medication(s) No data recorded    Patient/Caregiver did not have any additional questions or concerns.   Patient's therapy is appropriate to: Initiate    Goals Addressed             This Visit's Progress    Achieve virologic cure as evidenced by SVR       Patient is initiating therapy. Patient will be evaluated at upcoming provider appointment to assess progress      Comply with lab assessments       Patient is on track. Patient will adhere to provider and/or lab appointments      Maintain optimal adherence to therapy       Patient is initiating therapy. Patient will be evaluated at upcoming provider appointment to assess progress         Alan JINNY Geralds Specialty Pharmacist

## 2024-07-26 ENCOUNTER — Other Ambulatory Visit: Payer: Self-pay

## 2024-07-26 ENCOUNTER — Telehealth: Payer: Self-pay

## 2024-07-26 NOTE — Telephone Encounter (Signed)
 Medication was picked up at [facility] on 07/25/24

## 2024-08-06 ENCOUNTER — Ambulatory Visit: Admitting: Pharmacist

## 2024-08-07 ENCOUNTER — Other Ambulatory Visit: Payer: Self-pay

## 2024-08-15 ENCOUNTER — Other Ambulatory Visit (HOSPITAL_COMMUNITY): Payer: Self-pay

## 2024-08-21 ENCOUNTER — Other Ambulatory Visit: Payer: Self-pay

## 2024-08-23 ENCOUNTER — Other Ambulatory Visit (HOSPITAL_COMMUNITY): Payer: Self-pay

## 2024-08-23 ENCOUNTER — Telehealth: Payer: Self-pay

## 2024-08-23 ENCOUNTER — Other Ambulatory Visit: Payer: Self-pay

## 2024-08-23 NOTE — Progress Notes (Signed)
 Specialty Pharmacy Refill Coordination Note  Craig [name] is a 83 y.o. male contacted today regarding refills of specialty medication(s) Sofosbuvir -Velpatasvir    Patient requested Delivery   Delivery date: 08/26/24   Verified address: [facility] [address] [place] [address]   Medication will be filled on 09.26.25.     Reaching out to donna and courtney for PA assistance.

## 2024-08-23 NOTE — Telephone Encounter (Signed)
 Pharmacy Patient Advocate Encounter   Received notification from [employer] that prior authorization for EPCLUSA  is required/requested.   Insurance verification completed.   The patient is insured through [facility] .   Per test claim: PA required; PA submitted to above mentioned insurance via Latent Key/confirmation #/EOC [employer] Status is pending

## 2024-08-26 ENCOUNTER — Other Ambulatory Visit: Payer: Self-pay

## 2024-08-26 ENCOUNTER — Telehealth: Payer: Self-pay

## 2024-08-26 ENCOUNTER — Other Ambulatory Visit (HOSPITAL_COMMUNITY): Payer: Self-pay

## 2024-08-26 NOTE — Telephone Encounter (Signed)
 Pharmacy Patient Advocate Encounter   Received notification from Lat[employer] that prior authorization for EPCLUSA  is required/requested.   Insurance verification completed.   The patient is insured through [facility] MEDICARE .   Per test claim: PA required; PA submitted to above mentioned insurance via Latent Key/confirmation #/EOC [employer] Status is pending

## 2024-08-26 NOTE — Telephone Encounter (Signed)
 Pharmacy Patient Advocate Encounter  Received notification from [facility] MEDICARE that Prior Authorization for EPCLUSA  has been APPROVED from 08/23/24 to 11/15/24   PA #/Case ID/Reference #: [id number]
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
## 2024-08-26 NOTE — Progress Notes (Signed)
 Received paid claim on 08/26/24. New delivery date for patient is 08/27/24. Phone did not ring. Unable to leave voicemail.

## 2024-08-27 ENCOUNTER — Ambulatory Visit: Admitting: Pharmacist

## 2024-08-27 ENCOUNTER — Other Ambulatory Visit: Payer: Self-pay

## 2024-08-27 ENCOUNTER — Telehealth: Payer: Self-pay

## 2024-08-27 DIAGNOSIS — I700 Atherosclerosis of aorta: Secondary | ICD-10-CM | POA: Diagnosis not present

## 2024-08-27 DIAGNOSIS — Z23 Encounter for immunization: Secondary | ICD-10-CM | POA: Diagnosis not present

## 2024-08-27 DIAGNOSIS — J432 Centrilobular emphysema: Secondary | ICD-10-CM | POA: Diagnosis not present

## 2024-08-27 DIAGNOSIS — E213 Hyperparathyroidism, unspecified: Secondary | ICD-10-CM | POA: Diagnosis not present

## 2024-08-27 DIAGNOSIS — Z0000 Encounter for general adult medical examination without abnormal findings: Secondary | ICD-10-CM | POA: Diagnosis not present

## 2024-08-27 DIAGNOSIS — B182 Chronic viral hepatitis C: Secondary | ICD-10-CM

## 2024-08-27 DIAGNOSIS — E1165 Type 2 diabetes mellitus with hyperglycemia: Secondary | ICD-10-CM | POA: Diagnosis not present

## 2024-08-27 DIAGNOSIS — I714 Abdominal aortic aneurysm, without rupture, unspecified: Secondary | ICD-10-CM | POA: Diagnosis not present

## 2024-08-27 DIAGNOSIS — E7800 Pure hypercholesterolemia, unspecified: Secondary | ICD-10-CM | POA: Diagnosis not present

## 2024-08-27 DIAGNOSIS — R590 Localized enlarged lymph nodes: Secondary | ICD-10-CM | POA: Diagnosis not present

## 2024-08-27 DIAGNOSIS — I10 Essential (primary) hypertension: Secondary | ICD-10-CM | POA: Diagnosis not present

## 2024-08-27 DIAGNOSIS — E041 Nontoxic single thyroid nodule: Secondary | ICD-10-CM | POA: Diagnosis not present

## 2024-08-27 NOTE — Telephone Encounter (Signed)
 [facility] Patient Advocate Encounter  Patient's medications EPCLUSA  have been couriered to [facility] from [facility] and will be picked up at the patients appointment on 08/27/24.  2ND BOX   Charmaine Sharps, CPhT Specialty Pharmacy Patient [facility] for Infectious Disease Phone: [phone] Fax:  [phone]

## 2024-08-29 LAB — HEPATITIS C RNA QUANTITATIVE
HCV Quantitative Log: <1.18 {Log_IU}/mL
HCV RNA, PCR, QN: <15 [IU]/mL

## 2024-09-05 DIAGNOSIS — H04123 Dry eye syndrome of bilateral lacrimal glands: Secondary | ICD-10-CM | POA: Diagnosis not present

## 2024-09-05 DIAGNOSIS — H4321 Crystalline deposits in vitreous body, right eye: Secondary | ICD-10-CM | POA: Diagnosis not present

## 2024-09-05 DIAGNOSIS — H26493 Other secondary cataract, bilateral: Secondary | ICD-10-CM | POA: Diagnosis not present

## 2024-09-18 ENCOUNTER — Other Ambulatory Visit (HOSPITAL_COMMUNITY): Payer: Self-pay

## 2024-09-18 ENCOUNTER — Other Ambulatory Visit: Payer: Self-pay

## 2024-09-18 NOTE — Progress Notes (Signed)
 Specialty Pharmacy Refill Coordination Note  Spoke with Craig [name]  Craig [name] is a 83 y.o. male contacted today regarding refills of specialty medication(s) Sofosbuvir -Velpatasvir   Doses on hand: 8   Patient requested: Courier to Provider Office   Delivery date: 09/23/24 (PPU 09/24/24 or 09/25/24)   Verified address: [facility] [address] [place] [address]  Medication will be filled on 09/20/24.

## 2024-09-19 ENCOUNTER — Other Ambulatory Visit: Payer: Self-pay

## 2024-09-23 ENCOUNTER — Telehealth: Payer: Self-pay

## 2024-09-23 NOTE — Telephone Encounter (Signed)
 [facility] Patient Advocate Encounter  Patient's medications Epclusa  have been couriered to [facility] from [facility] pharmacy and will be picked up on 09/24/24.  3rd Epclusa  box  Arland Hutchinson, CPhT Specialty Pharmacy Patient [facility] for Infectious Disease Phone: [phone] Fax:  [phone]

## 2024-09-26 ENCOUNTER — Other Ambulatory Visit (HOSPITAL_COMMUNITY): Payer: Self-pay

## 2024-10-10 ENCOUNTER — Other Ambulatory Visit: Payer: Self-pay

## 2024-11-12 ENCOUNTER — Encounter: Payer: Self-pay | Admitting: Infectious Diseases

## 2024-11-12 ENCOUNTER — Other Ambulatory Visit: Payer: Self-pay

## 2024-11-12 ENCOUNTER — Ambulatory Visit: Admitting: Infectious Diseases

## 2024-11-12 VITALS — BP 146/84 | HR 62 | Temp 97.6°F | Ht 72.0 in | Wt 205.0 lb

## 2024-11-12 DIAGNOSIS — B182 Chronic viral hepatitis C: Secondary | ICD-10-CM

## 2024-11-12 DIAGNOSIS — Z23 Encounter for immunization: Secondary | ICD-10-CM | POA: Diagnosis not present

## 2024-11-12 DIAGNOSIS — Z79899 Other long term (current) drug therapy: Secondary | ICD-10-CM | POA: Diagnosis not present

## 2024-11-12 DIAGNOSIS — Z7185 Encounter for immunization safety counseling: Secondary | ICD-10-CM | POA: Diagnosis not present
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
## 2024-11-14 LAB — HEPATITIS C RNA QUANTITATIVE
HCV Quantitative Log: <1.18 {Log_IU}/mL
HCV RNA, PCR, QN: <15 [IU]/mL

## 2024-11-18 ENCOUNTER — Ambulatory Visit: Payer: Self-pay | Admitting: Infectious Diseases

## 2024-12-07 ENCOUNTER — Ambulatory Visit: Admission: EM | Admit: 2024-12-07 | Discharge: 2024-12-07 | Disposition: A | Discharge status: Home / Self Care

## 2024-12-07 DIAGNOSIS — J209 Acute bronchitis, unspecified: Secondary | ICD-10-CM

## 2024-12-07 MED ORDER — GUAIFENESIN ER 600 MG PO TB12: 600.0000 mg | ORAL_TABLET | Freq: Two times a day (BID) | ORAL | 0 refills | Status: AC

## 2024-12-07 MED ORDER — BENZONATATE 100 MG PO CAPS: 100.0000 mg | ORAL_CAPSULE | Freq: Three times a day (TID) | ORAL | 0 refills | Status: AC

## 2024-12-07 MED ORDER — PREDNISONE 50 MG PO TABS: ORAL_TABLET | ORAL | 0 refills | Status: AC
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
## 2024-12-07 NOTE — Discharge Instructions (Addendum)
 Diagnosed with bronchitis today. Do not qualify for covid or flu testing. Start meds prescribed, symptoms should improve in 3-5 days. If you develop fever or 100.5 or above or loss of appetite we should return for further evaluation and chest xray

## 2024-12-07 NOTE — ED Triage Notes (Signed)
 Pt present a cough with congestion, symptoms started three days ago. Pt states took OTC medication with some relief.

## 2024-12-14 ENCOUNTER — Ambulatory Visit (HOSPITAL_COMMUNITY)
Admission: RE | Admit: 2024-12-14 | Discharge: 2024-12-14 | Disposition: A | Source: Ambulatory Visit | Attending: Urgent Care

## 2024-12-14 ENCOUNTER — Ambulatory Visit: Payer: Self-pay | Admitting: Urgent Care

## 2024-12-14 ENCOUNTER — Ambulatory Visit
Admission: EM | Admit: 2024-12-14 | Discharge: 2024-12-14 | Disposition: A | Discharge status: Home / Self Care | Attending: Family Medicine | Admitting: Family Medicine

## 2024-12-14 DIAGNOSIS — B9689 Other specified bacterial agents as the cause of diseases classified elsewhere: Secondary | ICD-10-CM

## 2024-12-14 DIAGNOSIS — J208 Acute bronchitis due to other specified organisms: Secondary | ICD-10-CM | POA: Diagnosis not present

## 2024-12-14 MED ORDER — AZITHROMYCIN 250 MG PO TABS: ORAL_TABLET | ORAL | 0 refills | Status: AC
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
## 2024-12-14 NOTE — ED Triage Notes (Signed)
 Pt reports cough and chest congestion 1 1/2 week. States he had some treatment another UC last week and had some relief.

## 2024-12-14 NOTE — Discharge Instructions (Addendum)
 Started azithromycin  to help with your bronchitis infection.  Please go straight to the urgent care at our [facility] [place] location.  I will contact you later today with your x-ray results.
